# Patient Record
Sex: Male | Born: 1950 | Race: White | Hispanic: No | Marital: Single | Smoking: Never smoker
Health system: Southern US, Community
[De-identification: ages and names within clinical notes are randomized; demographics above are authoritative.]

## PROBLEM LIST (undated history)

## (undated) DIAGNOSIS — K219 Gastro-esophageal reflux disease without esophagitis: Secondary | ICD-10-CM

## (undated) DIAGNOSIS — J45909 Unspecified asthma, uncomplicated: Secondary | ICD-10-CM

## (undated) DIAGNOSIS — L720 Epidermal cyst: Secondary | ICD-10-CM

## (undated) DIAGNOSIS — I10 Essential (primary) hypertension: Secondary | ICD-10-CM

## (undated) DIAGNOSIS — M1990 Unspecified osteoarthritis, unspecified site: Secondary | ICD-10-CM

## (undated) DIAGNOSIS — G4733 Obstructive sleep apnea (adult) (pediatric): Principal | ICD-10-CM

## (undated) DIAGNOSIS — G709 Myoneural disorder, unspecified: Secondary | ICD-10-CM

## (undated) HISTORY — PX: WRIST FRACTURE SURGERY: SHX121

## (undated) HISTORY — PX: NASAL SEPTUM SURGERY: SHX37

## (undated) HISTORY — PX: TONSILLECTOMY: SUR1361

## (undated) HISTORY — DX: Obstructive sleep apnea (adult) (pediatric): G47.33

## (undated) HISTORY — PX: HERNIA REPAIR: SHX51

## (undated) HISTORY — DX: Essential (primary) hypertension: I10

## (undated) HISTORY — DX: Unspecified asthma, uncomplicated: J45.909

---

## 2010-08-09 ENCOUNTER — Emergency Department (HOSPITAL_COMMUNITY)
Admission: EM | Admit: 2010-08-09 | Discharge: 2010-08-09 | Payer: Self-pay | Source: Home / Self Care | Admitting: Emergency Medicine

## 2011-02-17 ENCOUNTER — Emergency Department (HOSPITAL_COMMUNITY)
Admission: EM | Admit: 2011-02-17 | Discharge: 2011-02-17 | Disposition: A | Discharge status: Home / Self Care | Payer: BC Managed Care – PPO | Attending: Emergency Medicine | Admitting: Emergency Medicine

## 2011-02-17 ENCOUNTER — Emergency Department (HOSPITAL_COMMUNITY): Payer: BC Managed Care – PPO

## 2011-02-17 DIAGNOSIS — R0989 Other specified symptoms and signs involving the circulatory and respiratory systems: Secondary | ICD-10-CM | POA: Insufficient documentation

## 2011-02-17 DIAGNOSIS — K219 Gastro-esophageal reflux disease without esophagitis: Secondary | ICD-10-CM | POA: Insufficient documentation

## 2011-02-17 DIAGNOSIS — I10 Essential (primary) hypertension: Secondary | ICD-10-CM | POA: Insufficient documentation

## 2011-02-17 DIAGNOSIS — R0609 Other forms of dyspnea: Secondary | ICD-10-CM | POA: Insufficient documentation

## 2011-02-17 LAB — DIFFERENTIAL
Band Neutrophils: 0 % (ref 0–10)
Basophils Absolute: 0 10*3/uL (ref 0.0–0.1)
Basophils Relative: 0 % (ref 0–1)
Blasts: 0 %
Eosinophils Absolute: 1.1 10*3/uL — ABNORMAL HIGH (ref 0.0–0.7)
Eosinophils Relative: 14 % — ABNORMAL HIGH (ref 0–5)
Lymphocytes Relative: 33 % (ref 12–46)
Lymphs Abs: 2.5 10*3/uL (ref 0.7–4.0)
Metamyelocytes Relative: 0 %
Monocytes Absolute: 0.3 10*3/uL (ref 0.1–1.0)
Monocytes Relative: 4 % (ref 3–12)
Myelocytes: 0 %
Neutro Abs: 3.8 10*3/uL (ref 1.7–7.7)
Neutrophils Relative %: 49 % (ref 43–77)
Promyelocytes Absolute: 0 %
nRBC: 0 /100 WBC

## 2011-02-17 LAB — BLOOD GAS, VENOUS
Acid-Base Excess: 2.2 mmol/L — ABNORMAL HIGH (ref 0.0–2.0)
Bicarbonate: 27 mEq/L — ABNORMAL HIGH (ref 20.0–24.0)
Drawn by: 235321
FIO2: 0.21 %
O2 Saturation: 78.6 %
Patient temperature: 98.6
TCO2: 23.2 mmol/L (ref 0–100)
pCO2, Ven: 44.2 mmHg — ABNORMAL LOW (ref 45.0–50.0)
pH, Ven: 7.402 — ABNORMAL HIGH (ref 7.250–7.300)
pO2, Ven: 44.8 mmHg (ref 30.0–45.0)

## 2011-02-17 LAB — POCT I-STAT, CHEM 8
BUN: 13 mg/dL (ref 6–23)
Calcium, Ion: 1.11 mmol/L — ABNORMAL LOW (ref 1.12–1.32)
Chloride: 103 mEq/L (ref 96–112)
Creatinine, Ser: 1 mg/dL (ref 0.50–1.35)
Glucose, Bld: 105 mg/dL — ABNORMAL HIGH (ref 70–99)
HCT: 50 % (ref 39.0–52.0)
Hemoglobin: 17 g/dL (ref 13.0–17.0)
Potassium: 3.4 mEq/L — ABNORMAL LOW (ref 3.5–5.1)
Sodium: 139 mEq/L (ref 135–145)
TCO2: 24 mmol/L (ref 0–100)

## 2011-02-17 LAB — CBC
HCT: 42.9 % (ref 39.0–52.0)
Hemoglobin: 14.9 g/dL (ref 13.0–17.0)
MCH: 30.8 pg (ref 26.0–34.0)
MCHC: 34.7 g/dL (ref 30.0–36.0)
MCV: 88.8 fL (ref 78.0–100.0)
Platelets: 208 10*3/uL (ref 150–400)
RBC: 4.83 MIL/uL (ref 4.22–5.81)
RDW: 12.6 % (ref 11.5–15.5)
WBC: 7.7 10*3/uL (ref 4.0–10.5)

## 2011-02-17 LAB — TROPONIN I: Troponin I: <0.3 ng/mL (ref <0.30)

## 2011-02-17 LAB — CK TOTAL AND CKMB (NOT AT ARMC)
CK, MB: 3.2 ng/mL (ref 0.3–4.0)
Relative Index: 2.5 (ref 0.0–2.5)
Total CK: 127 U/L (ref 7–232)

## 2011-02-17 LAB — PROTIME-INR
INR: 1.04 (ref 0.00–1.49)
Prothrombin Time: 13.8 seconds (ref 11.6–15.2)

## 2011-02-17 LAB — APTT: aPTT: 29 seconds (ref 24–37)

## 2011-02-17 LAB — D-DIMER, QUANTITATIVE: D-Dimer, Quant: 0.36 ug/mL-FEU (ref 0.00–0.48)

## 2011-11-07 ENCOUNTER — Ambulatory Visit: Payer: BC Managed Care – PPO | Admitting: Family Medicine

## 2012-07-26 ENCOUNTER — Other Ambulatory Visit: Payer: Self-pay

## 2013-01-27 ENCOUNTER — Encounter (INDEPENDENT_AMBULATORY_CARE_PROVIDER_SITE_OTHER): Payer: Self-pay | Admitting: Surgery

## 2013-01-27 ENCOUNTER — Ambulatory Visit (INDEPENDENT_AMBULATORY_CARE_PROVIDER_SITE_OTHER): Payer: BC Managed Care – PPO | Admitting: Surgery

## 2013-01-27 VITALS — BP 138/76 | HR 78 | Temp 98.0°F | Resp 20 | Ht 71.0 in | Wt 246.0 lb

## 2013-01-27 DIAGNOSIS — K420 Umbilical hernia with obstruction, without gangrene: Secondary | ICD-10-CM

## 2013-01-27 NOTE — Patient Instructions (Addendum)
[facility]  HERNIA REPAIR POST OP INSTRUCTIONS  Always review your discharge instruction sheet given to you by the facility where your surgery was performed.  1. A  prescription for pain medication may be given to you upon discharge.  Take your pain medication as prescribed.  If narcotic pain medicine is not needed, then you may take acetaminophen (Tylenol) or ibuprofen (Advil) as needed.  2. Take your usually prescribed medications unless otherwise directed.  3. If you need a refill on your pain medication, please contact your pharmacy.  They will contact our office to request authorization. Prescriptions will not be filled after 5 pm daily or on weekends.  4. You should follow a light diet the first 24 hours after arrival home, such as soup and crackers or toast.  Be sure to include plenty of fluids daily.  Resume your normal diet the day after surgery.  5. Most patients will experience some swelling and bruising around the surgical site.  Ice packs and reclining will help.  Swelling and bruising can take several days to resolve.   6. It is common to experience some constipation if taking pain medication after surgery.  Increasing fluid intake and taking a stool softener (such as Colace) will usually help or prevent this problem from occurring.  A mild laxative (Milk of Magnesia or Miralax) should be taken according to package directions if there are no bowel movements after 48 hours.  7. Unless discharge instructions indicate otherwise, you may remove your bandages 24-48 hours after surgery, and you may shower at that time.  You may have steri-strips (small skin tapes) in place directly over the incision.  These strips should be left on the skin for 7-10 days.  If your surgeon used skin glue on the incision, you may shower in 24 hours.  The glue will flake off over the next 2-3 weeks.  Any sutures or staples will be removed at the office during your follow-up  visit.  8. ACTIVITIES:  You may resume regular (light) daily activities beginning the next day-such as daily self-care, walking, climbing stairs-gradually increasing activities as tolerated.  You may have sexual intercourse when it is comfortable.  Refrain from any heavy lifting or straining until approved by your doctor.  You may drive when you are no longer taking prescription pain medication, you can comfortably wear a seatbelt, and you can safely maneuver your car and apply brakes.  9. You should see your doctor in the office for a follow-up appointment approximately 2-3 weeks after your surgery.  Make sure that you call for this appointment within a day or two after you arrive home to insure a convenient appointment time. 10.   WHEN TO CALL YOUR DOCTOR: 1. Fever greater than 101.0 2. Inability to urinate 3. Persistent nausea and/or vomiting 4. Extreme swelling or bruising 5. Continued bleeding from incision 6. Increased pain, redness, or drainage from the incision  The clinic staff is available to answer your questions during regular business hours.  Please don't hesitate to call and ask to speak to one of the nurses for clinical concerns.  If you have a medical emergency, go to the nearest emergency room or call 911.  A surgeon from [facility] is always on call for the hospital.   [facility] [address], [address], [place], [place]  [address]  [phone] ? [phone] ? FAX [phone]  [email or web address]   

## 2013-01-27 NOTE — Progress Notes (Signed)
General Surgery [facility]  Chief Complaint  Patient presents with  . Umbilical Hernia    eval umb hernia    HISTORY: Patient is a 62 year old male who is self-referred for umbilical hernia repair. Patient was noted to have an umbilical hernia approximately 6-7 years ago by his chiropractor. It has slowly enlarged. It causes him intermittent discomfort. He has had no signs or symptoms of obstruction. He has had no prior abdominal surgery. He presents today to consider repair.  Past Medical History  Diagnosis Date  . Hypertension   . Asthma      Current Outpatient Prescriptions  Medication Sig Dispense Refill  . lisinopril (PRINIVIL,ZESTRIL) 20 MG tablet Take 20 mg by mouth daily.      . montelukast (SINGULAIR) 10 MG tablet Take 10 mg by mouth at bedtime.       No current facility-administered medications for this visit.     Allergies  Allergen Reactions  . Vicodin (Hydrocodone-Acetaminophen) Palpitations     History reviewed. No pertinent family history.   History   Social History  . Marital Status: Single    Spouse Name: N/A    Number of Children: N/A  . Years of Education: N/A   Social History Main Topics  . Smoking status: Never Smoker   . Smokeless tobacco: None  . Alcohol Use: Yes  . Drug Use: No  . Sexually Active: None   Other Topics Concern  . None   Social History Narrative  . None     REVIEW OF SYSTEMS - PERTINENT POSITIVES ONLY: Rare discomfort. No obstructive symptoms.  EXAM: There were no vitals filed for this visit.  HEENT: normocephalic; pupils equal and reactive; sclerae clear; dentition good; mucous membranes moist NECK:  symmetric on extension; no palpable anterior or posterior cervical lymphadenopathy; no supraclavicular masses; no tenderness CHEST: clear to auscultation bilaterally with scattered rhonchi and wheezes CARDIAC: regular rate and rhythm without significant murmur; peripheral pulses are  full ABDOMEN: soft without distension; bowel sounds present; no mass; no hepatosplenomegaly; Moderate rectus diastases; obvious umbilical hernia with incarcerated omentum, fascial defect probably 2-3 cm in diameter EXT:  non-tender without edema; no deformity NEURO: no gross focal deficits; no sign of tremor   LABORATORY RESULTS: See [facility] (CHL-Epic) for most recent results   RADIOLOGY RESULTS: See [facility] (CHL-Epic) for most recent results   IMPRESSION: Incarcerated umbilical hernia, minimally symptomatic  PLAN: The patient and I discussed surgical repair. We discussed the use of synthetic mesh. We discussed the outpatient procedure, the restrictions on his activities following the procedure, and the time frame of his return to normal activity. He understands and wishes to proceed. I provided him with written literature to review regarding the surgery. We will make arrangements for outpatient surgery in the near future.  The risks and benefits of the procedure have been discussed at length with the patient.  The patient understands the proposed procedure, potential alternative treatments, and the course of recovery to be expected.  All of the patient's questions have been answered at this time.  The patient wishes to proceed with surgery.  Velora Heckler, MD, FACS General & Endocrine Surgery [facility]   Visit Diagnoses: 1. Umbilical hernia, incarcerated     Primary Care Physician: Provider Not In System

## 2013-02-17 ENCOUNTER — Telehealth (INDEPENDENT_AMBULATORY_CARE_PROVIDER_SITE_OTHER): Payer: Self-pay | Admitting: General Surgery

## 2013-02-17 NOTE — Telephone Encounter (Signed)
This patient was a face to face walk in on 02/17/13 he has questions about the following  . He was in a [place] an the friends ask why he was not on a machine?  he thought he might have COPD. He thought his blood pressure might be up and was a risk for surgery, he says it was 140/72. I advised him to contact his PCP and to go by and have it checked  if he felt it was high . He ask if his insurance was going to cover the procedure ? I ask him if he had spoke to the  Surgery scheduler here at [facility] ? He said yes they had talk to him about everything .In general I feel he is having a bit of anxiety over his up coming surgery with Dr Gerrit Friends on 03/01/13. After a long conversation he said he was feeling much calmer.

## 2013-02-25 ENCOUNTER — Inpatient Hospital Stay (HOSPITAL_COMMUNITY): Admission: RE | Admit: 2013-02-25 | Payer: BC Managed Care – PPO | Source: Ambulatory Visit

## 2013-03-15 ENCOUNTER — Encounter (INDEPENDENT_AMBULATORY_CARE_PROVIDER_SITE_OTHER): Payer: BC Managed Care – PPO | Admitting: Surgery

## 2013-04-01 ENCOUNTER — Encounter (HOSPITAL_COMMUNITY): Payer: Self-pay | Admitting: Pharmacy Technician

## 2013-04-05 ENCOUNTER — Other Ambulatory Visit (HOSPITAL_COMMUNITY): Payer: Self-pay | Admitting: Surgery

## 2013-04-05 NOTE — Patient Instructions (Addendum)
20 Jamie Schwartz  04/05/2013   Your procedure is scheduled on:04/14/13  THURSDAY    Report to [facility] at   0530    AM.  Call this number if you have problems the morning of surgery: [phone]       Remember: [name] WITH YOU TO HOSPITAL  Do not eat food  Or drink :After Midnight. Wednesday NIGHT   Take these medicines the morning of surgery with A SIP OF WATER:Singulair        May use albuterol if needed   .  Contacts, dentures or partial plates can not be worn to surgery  Leave suitcase in the car. After surgery it may be brought to your room.  For patients admitted to the hospital, checkout time is 11:00 AM day of  discharge.             SPECIAL INSTRUCTIONS- SEE [place] PREPARING FOR SURGERY INSTRUCTION SHEET-     DO NOT WEAR JEWELRY, LOTIONS, POWDERS, OR PERFUMES.  WOMEN-- DO NOT SHAVE LEGS OR UNDERARMS FOR 12 HOURS BEFORE SHOWERS. MEN MAY SHAVE FACE.  Patients discharged the day of surgery will not be allowed to drive home. IF going home the day of surgery, you must have a driver and someone to stay with you for the first 24 hours  Name and phone number of your driver:  States will arrange before surgery                                                                                  [name]  PST [phone]  [phone]                 FAILURE TO FOLLOW THESE INSTRUCTIONS MAY RESULT IN  CANCELLATION   OF YOUR SURGERY                                                  Patient Signature _____________________________

## 2013-04-06 ENCOUNTER — Ambulatory Visit (HOSPITAL_COMMUNITY)
Admission: RE | Admit: 2013-04-06 | Discharge: 2013-04-06 | Disposition: A | Discharge status: Home / Self Care | Payer: BC Managed Care – PPO | Source: Ambulatory Visit | Attending: Surgery | Admitting: Surgery

## 2013-04-06 ENCOUNTER — Encounter (HOSPITAL_COMMUNITY): Payer: Self-pay

## 2013-04-06 ENCOUNTER — Encounter (HOSPITAL_COMMUNITY)
Admission: RE | Admit: 2013-04-06 | Discharge: 2013-04-06 | Disposition: A | Discharge status: Home / Self Care | Payer: BC Managed Care – PPO | Source: Ambulatory Visit | Attending: Surgery | Admitting: Surgery

## 2013-04-06 DIAGNOSIS — Z01810 Encounter for preprocedural cardiovascular examination: Secondary | ICD-10-CM | POA: Insufficient documentation

## 2013-04-06 DIAGNOSIS — Z01812 Encounter for preprocedural laboratory examination: Secondary | ICD-10-CM | POA: Insufficient documentation

## 2013-04-06 DIAGNOSIS — K429 Umbilical hernia without obstruction or gangrene: Secondary | ICD-10-CM | POA: Insufficient documentation

## 2013-04-06 DIAGNOSIS — Z01818 Encounter for other preprocedural examination: Secondary | ICD-10-CM | POA: Insufficient documentation

## 2013-04-06 HISTORY — DX: Gastro-esophageal reflux disease without esophagitis: K21.9

## 2013-04-06 HISTORY — DX: Unspecified osteoarthritis, unspecified site: M19.90

## 2013-04-06 LAB — BASIC METABOLIC PANEL
BUN: 14 mg/dL (ref 6–23)
CO2: 30 mEq/L (ref 19–32)
Calcium: 9.7 mg/dL (ref 8.4–10.5)
Chloride: 102 mEq/L (ref 96–112)
Creatinine, Ser: 0.91 mg/dL (ref 0.50–1.35)
GFR calc Af Amer: >90 mL/min (ref >90)
GFR calc non Af Amer: >90 mL/min (ref >90)
Glucose, Bld: 105 mg/dL — ABNORMAL HIGH (ref 70–99)
Potassium: 4.1 mEq/L (ref 3.5–5.1)
Sodium: 138 mEq/L (ref 135–145)

## 2013-04-06 LAB — CBC
HCT: 44.6 % (ref 39.0–52.0)
Hemoglobin: 15.4 g/dL (ref 13.0–17.0)
MCH: 31.4 pg (ref 26.0–34.0)
MCHC: 34.5 g/dL (ref 30.0–36.0)
MCV: 90.8 fL (ref 78.0–100.0)
Platelets: 242 10*3/uL (ref 150–400)
RBC: 4.91 MIL/uL (ref 4.22–5.81)
RDW: 13.1 % (ref 11.5–15.5)
WBC: 6.1 10*3/uL (ref 4.0–10.5)

## 2013-04-06 NOTE — Progress Notes (Signed)
04/06/13 1021  OBSTRUCTIVE SLEEP APNEA  Have you ever been diagnosed with sleep apnea through a sleep study? No  Do you snore loudly (loud enough to be heard through closed doors)?  1  Do you often feel tired, fatigued, or sleepy during the daytime? 0  Has anyone observed you stop breathing during your sleep? 0  Do you have, or are you being treated for high blood pressure? 1  BMI more than 35 kg/m2? 0  Age over 60 years old? 1  Neck circumference greater than 40 cm/18 inches? 0  Gender: 1  Obstructive Sleep Apnea Score 4  Score 4 or greater  Results sent to PCP

## 2013-04-06 NOTE — Progress Notes (Signed)
Faxed STOP BAND SCREEN to [facility]-  Ginger Carne at [phone] with confirmation

## 2013-04-07 ENCOUNTER — Telehealth (INDEPENDENT_AMBULATORY_CARE_PROVIDER_SITE_OTHER): Payer: Self-pay | Admitting: General Surgery

## 2013-04-07 NOTE — Telephone Encounter (Signed)
Patient called in explaining that he is worried for after surgery because he has no one that can help him.  He explained that he has no relatives and no friends that can help him.  He wanted to know if there is a facility that he could go to or if he could stay in the hospital for more than an outpatient procedure.  I explained that this would be orders that Dr. Gerrit Friends would have to put in if he wanted to extended his stay at the hospital.  Explained that I would forward this message to him and his nurse and have them call him back.

## 2013-04-07 NOTE — Telephone Encounter (Signed)
Patient is scheduled for umbilical hernia repair.  Will likely not require any post op care that he cannot provide for himself.  This is a semi-elective procedure, and if he thinks he will require help, he can postpone until such time as he has help at home.  There is no "facility" to which he can go after his procedure for care.  Velora Heckler, MD, [facility] Office: [phone]

## 2013-04-08 NOTE — Telephone Encounter (Signed)
Pt given below note verbally and states he understands.

## 2013-04-13 ENCOUNTER — Telehealth (INDEPENDENT_AMBULATORY_CARE_PROVIDER_SITE_OTHER): Payer: Self-pay | Admitting: *Deleted

## 2013-04-13 NOTE — Telephone Encounter (Signed)
Kim NP called to let us know that patient's surgery for tomorrow morning would need to be cancelled due to patient is in her office with an asthma exacerbation; sating 92% and difficulty breathing.  Spoke to Parrott LPN who sent Dr. Gerrit Friends a text trying to update him, told Debbie E with surgery schedulers.

## 2013-04-14 ENCOUNTER — Encounter (HOSPITAL_COMMUNITY): Admission: RE | Payer: Self-pay | Source: Ambulatory Visit

## 2013-04-14 ENCOUNTER — Ambulatory Visit (HOSPITAL_COMMUNITY): Admission: RE | Admit: 2013-04-14 | Payer: BC Managed Care – PPO | Source: Ambulatory Visit | Admitting: Surgery

## 2013-04-14 SURGERY — REPAIR, HERNIA, UMBILICAL, ADULT
Anesthesia: General

## 2013-05-10 ENCOUNTER — Encounter (INDEPENDENT_AMBULATORY_CARE_PROVIDER_SITE_OTHER): Payer: BC Managed Care – PPO | Admitting: Surgery

## 2013-07-19 NOTE — Progress Notes (Signed)
Need orders in EPIC.  Surgery scheduled for 08/05/13.  Preop on 08/02/13 at 100pm

## 2013-07-21 ENCOUNTER — Other Ambulatory Visit (INDEPENDENT_AMBULATORY_CARE_PROVIDER_SITE_OTHER): Payer: Self-pay | Admitting: Surgery

## 2013-07-29 ENCOUNTER — Encounter (HOSPITAL_COMMUNITY): Payer: Self-pay | Admitting: Pharmacy Technician

## 2013-08-02 ENCOUNTER — Encounter (HOSPITAL_COMMUNITY): Payer: Self-pay

## 2013-08-02 ENCOUNTER — Encounter (INDEPENDENT_AMBULATORY_CARE_PROVIDER_SITE_OTHER): Payer: Self-pay

## 2013-08-02 ENCOUNTER — Encounter (HOSPITAL_COMMUNITY)
Admission: RE | Admit: 2013-08-02 | Discharge: 2013-08-02 | Disposition: A | Discharge status: Home / Self Care | Payer: BC Managed Care – PPO | Source: Ambulatory Visit | Attending: Surgery | Admitting: Surgery

## 2013-08-02 DIAGNOSIS — L720 Epidermal cyst: Secondary | ICD-10-CM

## 2013-08-02 HISTORY — DX: Epidermal cyst: L72.0

## 2013-08-02 HISTORY — DX: Myoneural disorder, unspecified: G70.9

## 2013-08-02 LAB — BASIC METABOLIC PANEL
BUN: 12 mg/dL (ref 6–23)
CO2: 27 mEq/L (ref 19–32)
Calcium: 9.5 mg/dL (ref 8.4–10.5)
Chloride: 102 mEq/L (ref 96–112)
Creatinine, Ser: 0.8 mg/dL (ref 0.50–1.35)
GFR calc Af Amer: >90 mL/min (ref >90)
GFR calc non Af Amer: >90 mL/min (ref >90)
Glucose, Bld: 95 mg/dL (ref 70–99)
Potassium: 4 mEq/L (ref 3.5–5.1)
Sodium: 139 mEq/L (ref 135–145)

## 2013-08-02 LAB — CBC
HCT: 43.3 % (ref 39.0–52.0)
Hemoglobin: 14.6 g/dL (ref 13.0–17.0)
MCH: 31.2 pg (ref 26.0–34.0)
MCHC: 33.7 g/dL (ref 30.0–36.0)
MCV: 92.5 fL (ref 78.0–100.0)
Platelets: 254 10*3/uL (ref 150–400)
RBC: 4.68 MIL/uL (ref 4.22–5.81)
RDW: 13 % (ref 11.5–15.5)
WBC: 6.9 10*3/uL (ref 4.0–10.5)

## 2013-08-02 NOTE — Progress Notes (Signed)
Your Pt has screened with an elevated risk for obstructive sleep apnea using the Stop-Bang tool during a presurgical  Visit. A score of four or greater is an elevated risk. 

## 2013-08-02 NOTE — Pre-Procedure Instructions (Signed)
08-02-13 EKG/ CXR done today.

## 2013-08-02 NOTE — Patient Instructions (Addendum)
20 [name]  08/02/2013   Your procedure is scheduled on:   08-05-2013  Report to [facility] at     0700   AM .  Call this number if you have problems the morning of surgery: [phone]  Or Presurgical Testing [phone](Wilhemina)  For Living Will and/or Health Care Power Attorney Forms: please provide copy for your medical record,may bring AM of surgery(Forms should be already notarized -we do not provide this service).   Remember: Follow any bowel prep instructions per MD office. For Cpap use: Bring mask and tubing only.   Do not eat food:After Midnight.      Take these medicines the morning of surgery with A SIP OF WATER: Singulair. Use/bring Albuterol inhaler.   Do not wear jewelry, make-up or nail polish.  Do not wear lotions, powders, or perfumes. You may wear deodorant.  Do not shave 12 hours prior to first CHG shower(legs and under arms).(face and neck okay.)  Do not bring valuables to the hospital.(Hospital is not responsible for lost valuables).  Contacts, dentures or removable bridgework, body piercing, hair pins may not be worn into surgery.  Leave suitcase in the car. After surgery it may be brought to your room.  For patients admitted to the hospital, checkout time is 11:00 AM the day of discharge.   Patients discharged the day of surgery will not be allowed to drive home. Must have responsible person with you x 24 hours once discharged.  Name and phone number of your driver: Milagros Evener  Special Instructions: CHG(Chlorhedine 4%-"Hibiclens","Betasept","Aplicare") Shower Use Special Wash: see special instructions.(avoid face and genitals)     Failure to follow these instructions may result in Cancellation of your surgery.   Patient signature_______________________________________________________      PT NOTIFIED HIS SURGERY TIME WAS CHANGED TO 7:30 AM ON Friday 08/05/13 - AND HE SHOULD ARRIVE TO SHORT STAY CENTER BY 5:30 AM - ALL OTHER  INSTRUCTIONS THE SAME.

## 2013-08-03 NOTE — Progress Notes (Signed)
Quick Note:  These results are acceptable for scheduled surgery.  Shakthi Scipio M. Yovanna Cogan, MD, FACS [facility] Office: [phone]   ______ 

## 2013-08-05 ENCOUNTER — Telehealth (INDEPENDENT_AMBULATORY_CARE_PROVIDER_SITE_OTHER): Payer: Self-pay | Admitting: *Deleted

## 2013-08-05 ENCOUNTER — Ambulatory Visit (HOSPITAL_COMMUNITY): Payer: BC Managed Care – PPO | Admitting: Anesthesiology

## 2013-08-05 ENCOUNTER — Encounter (HOSPITAL_COMMUNITY): Payer: BC Managed Care – PPO | Admitting: Anesthesiology

## 2013-08-05 ENCOUNTER — Encounter (HOSPITAL_COMMUNITY): Payer: Self-pay | Admitting: *Deleted

## 2013-08-05 ENCOUNTER — Ambulatory Visit (HOSPITAL_COMMUNITY)
Admission: RE | Admit: 2013-08-05 | Discharge: 2013-08-05 | Disposition: A | Discharge status: Home / Self Care | Payer: BC Managed Care – PPO | Source: Ambulatory Visit | Attending: Surgery | Admitting: Surgery

## 2013-08-05 ENCOUNTER — Encounter (HOSPITAL_COMMUNITY)
Admission: RE | Disposition: A | Discharge status: Home / Self Care | Payer: Self-pay | Source: Ambulatory Visit | Attending: Surgery

## 2013-08-05 DIAGNOSIS — K420 Umbilical hernia with obstruction, without gangrene: Secondary | ICD-10-CM | POA: Diagnosis present

## 2013-08-05 DIAGNOSIS — Z79899 Other long term (current) drug therapy: Secondary | ICD-10-CM | POA: Insufficient documentation

## 2013-08-05 DIAGNOSIS — R0602 Shortness of breath: Secondary | ICD-10-CM | POA: Insufficient documentation

## 2013-08-05 DIAGNOSIS — K219 Gastro-esophageal reflux disease without esophagitis: Secondary | ICD-10-CM | POA: Insufficient documentation

## 2013-08-05 DIAGNOSIS — J45909 Unspecified asthma, uncomplicated: Secondary | ICD-10-CM | POA: Insufficient documentation

## 2013-08-05 DIAGNOSIS — I10 Essential (primary) hypertension: Secondary | ICD-10-CM | POA: Insufficient documentation

## 2013-08-05 HISTORY — PX: UMBILICAL HERNIA REPAIR: SHX196

## 2013-08-05 HISTORY — PX: INSERTION OF MESH: SHX5868

## 2013-08-05 SURGERY — REPAIR, HERNIA, UMBILICAL, ADULT
Anesthesia: General | Site: Abdomen

## 2013-08-05 MED ORDER — HYDROMORPHONE HCL PF 1 MG/ML IJ SOLN
INTRAMUSCULAR | Status: AC
  Filled 2013-08-05: qty 1

## 2013-08-05 MED ORDER — FENTANYL CITRATE 0.05 MG/ML IJ SOLN
INTRAMUSCULAR | Status: DC | PRN
  Administered 2013-08-05 (×5): 50 ug via INTRAVENOUS

## 2013-08-05 MED ORDER — BUPIVACAINE HCL 0.25 % IJ SOLN
INTRAMUSCULAR | Status: DC | PRN
  Administered 2013-08-05: 20 mL

## 2013-08-05 MED ORDER — 0.9 % SODIUM CHLORIDE (POUR BTL) OPTIME
TOPICAL | Status: DC | PRN
  Administered 2013-08-05: 1000 mL

## 2013-08-05 MED ORDER — CEFAZOLIN SODIUM-DEXTROSE 2-3 GM-% IV SOLR
INTRAVENOUS | Status: AC
  Filled 2013-08-05: qty 50

## 2013-08-05 MED ORDER — LIDOCAINE HCL (CARDIAC) 20 MG/ML IV SOLN
INTRAVENOUS | Status: AC
  Filled 2013-08-05: qty 5

## 2013-08-05 MED ORDER — BUPIVACAINE HCL (PF) 0.25 % IJ SOLN
INTRAMUSCULAR | Status: AC
  Filled 2013-08-05: qty 30

## 2013-08-05 MED ORDER — HYDROMORPHONE HCL PF 1 MG/ML IJ SOLN
0.2500 mg | INTRAMUSCULAR | Status: DC | PRN
  Administered 2013-08-05 (×2): 0.5 mg via INTRAVENOUS

## 2013-08-05 MED ORDER — MIDAZOLAM HCL 2 MG/2ML IJ SOLN
INTRAMUSCULAR | Status: AC
  Filled 2013-08-05: qty 2

## 2013-08-05 MED ORDER — OXYCODONE-ACETAMINOPHEN 5-325 MG PO TABS: 1.0000 | ORAL_TABLET | ORAL | Status: DC | PRN

## 2013-08-05 MED ORDER — PROPOFOL 10 MG/ML IV BOLUS
INTRAVENOUS | Status: DC | PRN
  Administered 2013-08-05: 200 mg via INTRAVENOUS

## 2013-08-05 MED ORDER — ONDANSETRON HCL 4 MG/2ML IJ SOLN
INTRAMUSCULAR | Status: DC | PRN
  Administered 2013-08-05: 4 mg via INTRAVENOUS

## 2013-08-05 MED ORDER — EPHEDRINE SULFATE 50 MG/ML IJ SOLN
INTRAMUSCULAR | Status: AC
  Filled 2013-08-05: qty 1

## 2013-08-05 MED ORDER — METOCLOPRAMIDE HCL 5 MG/ML IJ SOLN
INTRAMUSCULAR | Status: DC | PRN
  Administered 2013-08-05: 10 mg via INTRAVENOUS

## 2013-08-05 MED ORDER — LACTATED RINGERS IV SOLN
INTRAVENOUS | Status: DC | PRN
  Administered 2013-08-05: 07:00:00 via INTRAVENOUS

## 2013-08-05 MED ORDER — MIDAZOLAM HCL 5 MG/5ML IJ SOLN
INTRAMUSCULAR | Status: DC | PRN
  Administered 2013-08-05: 2 mg via INTRAVENOUS

## 2013-08-05 MED ORDER — METOCLOPRAMIDE HCL 5 MG/ML IJ SOLN
INTRAMUSCULAR | Status: AC
  Filled 2013-08-05: qty 2

## 2013-08-05 MED ORDER — CEFAZOLIN SODIUM-DEXTROSE 2-3 GM-% IV SOLR
2.0000 g | INTRAVENOUS | Status: AC
  Administered 2013-08-05: 2 g via INTRAVENOUS

## 2013-08-05 MED ORDER — ONDANSETRON HCL 4 MG/2ML IJ SOLN
INTRAMUSCULAR | Status: AC
  Filled 2013-08-05: qty 2

## 2013-08-05 MED ORDER — FENTANYL CITRATE 0.05 MG/ML IJ SOLN
INTRAMUSCULAR | Status: AC
  Filled 2013-08-05: qty 5

## 2013-08-05 MED ORDER — LIDOCAINE HCL 1 % IJ SOLN
INTRAMUSCULAR | Status: DC | PRN
  Administered 2013-08-05: 100 mg via INTRADERMAL

## 2013-08-05 MED ORDER — LACTATED RINGERS IV SOLN: INTRAVENOUS | Status: DC

## 2013-08-05 MED ORDER — PROPOFOL 10 MG/ML IV BOLUS
INTRAVENOUS | Status: AC
  Filled 2013-08-05: qty 20

## 2013-08-05 SURGICAL SUPPLY — 31 items
BENZOIN TINCTURE PRP APPL 2/3 (GAUZE/BANDAGES/DRESSINGS) ×3 IMPLANT
BLADE HEX COATED 2.75 (ELECTRODE) ×3 IMPLANT
BLADE SURG SZ10 CARB STEEL (BLADE) ×6 IMPLANT
CHLORAPREP W/TINT 26ML (MISCELLANEOUS) ×3 IMPLANT
CLEANER TIP ELECTROSURG 2X2 (MISCELLANEOUS) ×3 IMPLANT
DECANTER SPIKE VIAL GLASS SM (MISCELLANEOUS) IMPLANT
DRAPE LAPAROTOMY T 102X78X121 (DRAPES) ×3 IMPLANT
ELECT REM PT RETURN 9FT ADLT (ELECTROSURGICAL) ×3
ELECTRODE REM PT RTRN 9FT ADLT (ELECTROSURGICAL) ×2 IMPLANT
GLOVE BIOGEL PI IND STRL 7.0 (GLOVE) ×2 IMPLANT
GLOVE BIOGEL PI INDICATOR 7.0 (GLOVE) ×1
GLOVE SURG ORTHO 8.0 STRL STRW (GLOVE) ×3 IMPLANT
GOWN PREVENTION PLUS LG XLONG (DISPOSABLE) ×3 IMPLANT
GOWN STRL REIN XL XLG (GOWN DISPOSABLE) ×6 IMPLANT
KIT BASIN OR (CUSTOM PROCEDURE TRAY) ×3 IMPLANT
MARKER SKIN DUAL TIP RULER LAB (MISCELLANEOUS) ×3 IMPLANT
MESH VENTRALEX ST 2.5 CRC MED (Mesh General) ×3 IMPLANT
NEEDLE HYPO 25X1 1.5 SAFETY (NEEDLE) ×3 IMPLANT
NS IRRIG 1000ML POUR BTL (IV SOLUTION) ×3 IMPLANT
PACK BASIC VI WITH GOWN DISP (CUSTOM PROCEDURE TRAY) ×3 IMPLANT
PENCIL BUTTON HOLSTER BLD 10FT (ELECTRODE) ×3 IMPLANT
SPONGE GAUZE 4X4 12PLY (GAUZE/BANDAGES/DRESSINGS) ×3 IMPLANT
SPONGE LAP 4X18 X RAY DECT (DISPOSABLE) IMPLANT
STRIP CLOSURE SKIN 1/2X4 (GAUZE/BANDAGES/DRESSINGS) ×3 IMPLANT
SUT ETHIBOND NAB CT1 #1 30IN (SUTURE) ×3 IMPLANT
SUT MNCRL AB 4-0 PS2 18 (SUTURE) ×3 IMPLANT
SUT VIC AB 3-0 SH 18 (SUTURE) ×3 IMPLANT
SYR CONTROL 10ML LL (SYRINGE) ×3 IMPLANT
TAPE CLOTH SURG 4X10 WHT LF (GAUZE/BANDAGES/DRESSINGS) ×3 IMPLANT
TOWEL OR 17X26 10 PK STRL BLUE (TOWEL DISPOSABLE) ×3 IMPLANT
WATER STERILE IRR 1500ML POUR (IV SOLUTION) IMPLANT

## 2013-08-05 NOTE — Transfer of Care (Signed)
Immediate Anesthesia Transfer of Care Note  Patient: Jamie Schwartz  Procedure(s) Performed: Procedure(s): HERNIA REPAIR UMBILICAL ADULT (N/A) INSERTION OF MESH (N/A)  Patient Location: PACU  Anesthesia Type:General  Level of Consciousness: awake, oriented and patient cooperative  Airway & Oxygen Therapy: Patient Spontanous Breathing and Patient connected to face mask oxygen  Post-op Assessment: Report given to PACU RN, Post -op Vital signs reviewed and stable and Patient moving all extremities  Post vital signs: Reviewed and stable  Complications: No apparent anesthesia complications

## 2013-08-05 NOTE — Brief Op Note (Signed)
08/05/2013  8:37 AM  PATIENT:  Jamie Schwartz  62 y.o. male  PRE-OPERATIVE DIAGNOSIS:  INCARCERATED UMBILICAL HERNIA   POST-OPERATIVE DIAGNOSIS:  INCARCERATED UMBILICAL HERNIA   PROCEDURE:  Procedure(s): HERNIA REPAIR UMBILICAL ADULT (N/A) INSERTION OF MESH (N/A)  SURGEON:  Surgeon(s) and Role:    * Velora Heckler, MD - Primary  ANESTHESIA:   general  EBL:     BLOOD ADMINISTERED:none  DRAINS: none   LOCAL MEDICATIONS USED:  MARCAINE     SPECIMEN:  No Specimen  DISPOSITION OF SPECIMEN:  N/A  COUNTS:  YES  TOURNIQUET:  * No tourniquets in log *  DICTATION: .Other Dictation: Dictation Number [id number]  PLAN OF CARE: Discharge to home after PACU  PATIENT DISPOSITION:  PACU - hemodynamically stable.   Delay start of Pharmacological VTE agent (>24hrs) due to surgical blood loss or risk of bleeding: yes  Velora Heckler, MD, [facility] Office: [phone]

## 2013-08-05 NOTE — H&P (Signed)
Jamie Schwartz is an 62 y.o. male.    General Surgery [facility]  Chief Complaint: incarcerated umbilical hernia  HPI: Patient is a 62 year old male who is self-referred for umbilical hernia repair. Patient was noted to have an umbilical hernia approximately 6-7 years ago by his chiropractor. It has slowly enlarged. It causes him intermittent discomfort. He has had no signs or symptoms of obstruction. He has had no prior abdominal surgery. He presents today for repair.  Past Medical History  Diagnosis Date  . Hypertension   . GERD (gastroesophageal reflux disease)   . Arthritis   . Asthma     nebulizer tx used as needed. cortisone inj. for recent respiratory issues.  . Neuromuscular disorder     sciatic nerve pain in butttock-streoid inj. releived- 2 weeks ago  . Cyst of skin and subcutaneous tissue 08-02-13    posterior neck area    Past Surgical History  Procedure Laterality Date  . Tonsillectomy    . Nasal septum surgery    . Wrist fracture surgery      History reviewed. No pertinent family history. Social History:  reports that he has never smoked. He has quit using smokeless tobacco. He reports that he drinks alcohol. He reports that he does not use illicit drugs.  Allergies:  Allergies  Allergen Reactions  . Vicodin [Hydrocodone-Acetaminophen] Palpitations    SOB    Medications Prior to Admission  Medication Sig Dispense Refill  . albuterol (PROVENTIL HFA;VENTOLIN HFA) 108 (90 BASE) MCG/ACT inhaler Inhale 2 puffs into the lungs every 6 (six) hours as needed for wheezing.      Marland Kitchen lisinopril (PRINIVIL,ZESTRIL) 20 MG tablet Take 20 mg by mouth every morning.       . montelukast (SINGULAIR) 10 MG tablet Take 10 mg by mouth daily with breakfast.       . sodium chloride (OCEAN) 0.65 % nasal spray Place 1 spray into the nose daily as needed for congestion.      . Calcium-Magnesium-Vitamin D (CALCIUM MAGNESIUM PO) Take 1 capsule by mouth daily.      .  cholecalciferol (VITAMIN D) 1000 UNITS tablet Take 1,000 Units by mouth daily.      . folic acid (FOLVITE) 400 MCG tablet Take 400 mcg by mouth daily.      Marland Kitchen GARLIC PO Take 1 tablet by mouth daily.      . Melatonin 3 MG TABS Take 6-9 mg by mouth at bedtime.      . niacin 250 MG tablet Take 250 mg by mouth at bedtime.      . Red Yeast Rice Extract (RED YEAST RICE PO) Take 2 tablets by mouth at bedtime.      . selenium 50 MCG TABS tablet Take 50 mcg by mouth daily.      . vitamin C (ASCORBIC ACID) 500 MG tablet Take 500 mg by mouth daily.      . vitamin E (VITAMIN E) 1000 UNIT capsule Take 1,000 Units by mouth daily.        No results found for this or any previous visit (from the past 48 hour(s)). No results found.  Review of Systems  Constitutional: Negative.   HENT: Negative.   Eyes: Negative.   Respiratory: Positive for shortness of breath.   Cardiovascular: Negative.   Gastrointestinal: Negative.   Genitourinary: Negative.   Musculoskeletal: Negative.   Skin: Negative.   Neurological: Negative.   Endo/Heme/Allergies: Negative.   Psychiatric/Behavioral: Negative.  Blood pressure 161/82, pulse 71, temperature 97.3 F (36.3 C), temperature source Oral, resp. rate 18, SpO2 100.00%. Physical Exam  Constitutional: He is oriented to person, place, and time. He appears well-developed and well-nourished. No distress.  HENT:  Head: Normocephalic and atraumatic.  Right Ear: External ear normal.  Left Ear: External ear normal.  Eyes: Conjunctivae are normal. Pupils are equal, round, and reactive to light. No scleral icterus.  Neck: Normal range of motion. Neck supple. No thyromegaly present.  Cardiovascular: Normal rate, regular rhythm and normal heart sounds.   No murmur heard. Respiratory: Effort normal and breath sounds normal. He has no wheezes.  GI: Soft. Bowel sounds are normal. He exhibits no distension and no mass. There is no tenderness. There is no rebound and no  guarding.  Incarcerated umbilical hernia  Musculoskeletal: Normal range of motion. He exhibits no edema.  Lymphadenopathy:    He has no cervical adenopathy.  Neurological: He is alert and oriented to person, place, and time.  Skin: Skin is warm and dry.  Psychiatric: He has a normal mood and affect. His behavior is normal.     Assessment/Plan Incarcerated umbilical hernia  Plan repair with mesh  The risks and benefits of the procedure have been discussed at length with the patient.  The patient understands the proposed procedure, potential alternative treatments, and the course of recovery to be expected.  All of the patient's questions have been answered at this time.  The patient wishes to proceed with surgery.  Velora Heckler, MD, [facility] Office: [phone]    Jamie Schwartz M 08/05/2013, 7:13 AM

## 2013-08-05 NOTE — Anesthesia Preprocedure Evaluation (Addendum)
Anesthesia Evaluation  Patient identified by MRN, date of birth, ID band Patient awake    Reviewed: Allergy & Precautions, H&P , NPO status , Patient's Chart, lab work & pertinent test results  Airway Mallampati: II TM Distance: >3 FB Neck ROM: full    Dental no notable dental hx. (+) Teeth Intact and Dental Advisory Given   Pulmonary neg pulmonary ROS, asthma ,  May have sleep apnea. Mild asthma breath sounds clear to auscultation  Pulmonary exam normal       Cardiovascular Exercise Tolerance: Good hypertension, Pt. on medications negative cardio ROS  Rhythm:regular Rate:Normal     Neuro/Psych negative neurological ROS  negative psych ROS   GI/Hepatic negative GI ROS, Neg liver ROS, GERD-  Medicated and Controlled,  Endo/Other  negative endocrine ROS  Renal/GU negative Renal ROS  negative genitourinary   Musculoskeletal   Abdominal   Peds  Hematology negative hematology ROS (+)   Anesthesia Other Findings   Reproductive/Obstetrics negative OB ROS                         Anesthesia Physical Anesthesia Plan  ASA: II  Anesthesia Plan: General   Post-op Pain Management:    Induction: Intravenous  Airway Management Planned: LMA  Additional Equipment:   Intra-op Plan:   Post-operative Plan:   Informed Consent: I have reviewed the patients History and Physical, chart, labs and discussed the procedure including the risks, benefits and alternatives for the proposed anesthesia with the patient or authorized representative who has indicated his/her understanding and acceptance.   Dental Advisory Given  Plan Discussed with: CRNA and Surgeon  Anesthesia Plan Comments:        Anesthesia Quick Evaluation

## 2013-08-05 NOTE — Anesthesia Postprocedure Evaluation (Signed)
  Anesthesia Post-op Note  Patient: Jamie Schwartz  Procedure(s) Performed: Procedure(s) (LRB): HERNIA REPAIR UMBILICAL ADULT (N/A) INSERTION OF MESH (N/A)  Patient Location: PACU  Anesthesia Type: General  Level of Consciousness: awake and alert   Airway and Oxygen Therapy: Patient Spontanous Breathing  Post-op Pain: mild  Post-op Assessment: Post-op Vital signs reviewed, Patient's Cardiovascular Status Stable, Respiratory Function Stable, Patent Airway and No signs of Nausea or vomiting  Last Vitals:  Filed Vitals:   08/05/13 0945  BP: 122/68  Pulse: 70  Temp: 36.6 C  Resp: 17    Post-op Vital Signs: stable   Complications: No apparent anesthesia complications

## 2013-08-05 NOTE — Telephone Encounter (Signed)
Received a call from [facility], patient's caregiver, who states that patient can't take the prescription that Dr. Gerrit Friends gave him.  She was not with patient and could not answer questions.  I called the patient who stated that he was allergic to Hydrocodone.  I explained to patient that we did not prescribe him Hydrocodone, his prescription is for Oxycodone (Percocet).  Patient states "oh I can take Percocet but I didn't know that was what this was".  Patient is happy with his prescription at this time and agreeable with the prescription.  Dr. Gerrit Friends did offer Tramadol is patient doesn't want the Percocet prescription but patient states no he can take the Percocet and will take that.

## 2013-08-06 NOTE — Op Note (Signed)
NAME:  Jamie Schwartz, Jamie Schwartz NO.:  [id number]  MEDICAL RECORD NO.:  [id number]  LOCATION:  [facility]                         FACILITY:  [facility]  PHYSICIAN:  Velora Heckler, MD      DATE OF BIRTH:  [birth year]  DATE OF PROCEDURE:  08/05/2013                               OPERATIVE REPORT   PREOPERATIVE DIAGNOSIS:  Incarcerated umbilical hernia.  POSTOPERATIVE DIAGNOSIS:  Incarcerated umbilical hernia.  PROCEDURE:  Repair incarcerated umbilical hernia with Bard Ventralex ST mesh patch (6.4 cm).  SURGEON:  Velora Heckler, MD, FACS  ANESTHESIA:  General per Dr. Ronelle Nigh.  ESTIMATED BLOOD LOSS:  Minimal.  PREPARATION:  ChloraPrep.  COMPLICATIONS:  None.  INDICATIONS:  The patient is a 62 year old male who presented during the summer of 2014 with incarcerated umbilical hernia.  The patient now comes to Surgery for repair.  BODY OF REPORT:  Procedure was done in OR #1 at the [facility].  The patient was brought to the operating room, placed in a supine position on the operating room table.  Following administration of general anesthesia, the patient was prepped and draped in usual aseptic fashion.  After ascertaining that an adequate level of anesthesia had been achieved, an infraumbilical incision was made transversely.  Dissection was carried down through subcutaneous tissues using electrocautery for hemostasis.  Hernia sac was identified. Fascial plane was dissected out circumferentially.  Hernia sac was opened.  It contained incarcerated omentum.  Omentum was dissected out of the hernia sac and freed from the undersurface of the umbilicus and reduced back within the peritoneal cavity.  Fascial defect was then defined circumferentially.  It measured approximately 2.5 cm in greatest diameter.  A Bard Ventralex ST mesh patch was selected.  The medium size measuring 6.4 cm was selected.  Patch was prepared and was inserted into the preperitoneal  space.  It was deployed circumferentially.  It was secured to the fascia with interrupted 0 Ethibond simple sutures circumferentially.  Local field block was placed with Marcaine. Umbilicus was re-affixed to the abdominal wall with an interrupted 0 Ethibond suture.  Subcutaneous tissues were closed with interrupted 3-0 Vicryl sutures.  Skin edges were anesthetized with local anesthetic. Skin edges were reapproximated with a running 4-0 Monocryl subcuticular suture.  Wound was washed and dried, and benzoin and Steri-Strips were applied. Sterile dressings were applied.  The patient was awakened from anesthesia and brought to the recovery room.  The patient tolerated the procedure well.   Velora Heckler, MD, [facility] Office: [phone]    TMG/MEDQ  D:  08/05/2013  T:  08/06/2013  Job:  [id number]

## 2013-08-08 ENCOUNTER — Encounter (HOSPITAL_COMMUNITY): Payer: Self-pay | Admitting: Surgery

## 2013-08-08 ENCOUNTER — Telehealth (INDEPENDENT_AMBULATORY_CARE_PROVIDER_SITE_OTHER): Payer: Self-pay | Admitting: *Deleted

## 2013-08-08 ENCOUNTER — Telehealth (INDEPENDENT_AMBULATORY_CARE_PROVIDER_SITE_OTHER): Payer: Self-pay

## 2013-08-08 NOTE — Telephone Encounter (Signed)
LMOM for pt to return my call.  I was calling to inform him of his postop appt with Dr. Gerrit Friends on 08/24/13 with an arrival time of 11:45am.

## 2013-08-08 NOTE — Telephone Encounter (Signed)
Pt caregiver called letting us know pt broke out in a rash over his abdomen where it was prepped with the soap prior to surgery. They started him on benadryl and the rash is starting to go away. Advised to continue benadryl until rash has resolved. They will follow up at scheduled appointment date or call for sooner appt should symptoms worsen or fail to improve.

## 2013-08-22 ENCOUNTER — Encounter (HOSPITAL_COMMUNITY): Payer: Self-pay | Admitting: Surgery

## 2013-08-24 ENCOUNTER — Encounter (INDEPENDENT_AMBULATORY_CARE_PROVIDER_SITE_OTHER): Payer: Self-pay | Admitting: Surgery

## 2013-08-24 ENCOUNTER — Ambulatory Visit (INDEPENDENT_AMBULATORY_CARE_PROVIDER_SITE_OTHER): Payer: BC Managed Care – PPO | Admitting: Surgery

## 2013-08-24 VITALS — BP 132/84 | HR 88 | Temp 98.4°F | Resp 15 | Ht 71.0 in | Wt 262.0 lb

## 2013-08-24 DIAGNOSIS — K420 Umbilical hernia with obstruction, without gangrene: Secondary | ICD-10-CM

## 2013-08-24 NOTE — Patient Instructions (Signed)
  COCOA BUTTER & VITAMIN E CREAM  (Palmer's or other brand)  Apply cocoa butter/vitamin E cream to your incision 2 - 3 times daily.  Massage cream into incision for one minute with each application.  Use sunscreen (50 SPF or higher) for first 6 months after surgery if area is exposed to sun.  You may substitute Mederma or other scar reducing creams as desired.   

## 2013-08-24 NOTE — Progress Notes (Signed)
General Surgery [facility]  Chief Complaint  Patient presents with  . Routine Post Op    umbilical hernia repair with mesh 08/05/2013    HISTORY: Patient is a 63 year old male who underwent repair of umbilical hernia with mesh on 08/05/2013. Postoperative course has been uneventful.  EXAM: Mild to moderate soft tissue swelling at the umbilicus. No sign of infection. No sign of seroma. Without solid there is no sign of recurrence.  IMPRESSION: Status post umbilical hernia repair with mesh  PLAN: Patient will begin to apply topical creams to his incision. He may resume aerobic exercise. I've asked him to avoid strenuous lifting. He'll return in 3-4 weeks for final wound check.  Velora Hecklerodd M. Bela Nyborg, MD, FACS General & Endocrine Surgery [facility]   Visit Diagnoses: 1. Umbilical hernia, incarcerated

## 2013-09-26 ENCOUNTER — Encounter (INDEPENDENT_AMBULATORY_CARE_PROVIDER_SITE_OTHER): Payer: Self-pay | Admitting: Surgery

## 2013-09-26 ENCOUNTER — Ambulatory Visit (INDEPENDENT_AMBULATORY_CARE_PROVIDER_SITE_OTHER): Payer: BC Managed Care – PPO | Admitting: Surgery

## 2013-09-26 DIAGNOSIS — K420 Umbilical hernia with obstruction, without gangrene: Secondary | ICD-10-CM

## 2013-09-26 NOTE — Patient Instructions (Signed)
  COCOA BUTTER & VITAMIN E CREAM  (Palmer's or other brand)  Apply cocoa butter/vitamin E cream to your incision 2 - 3 times daily.  Massage cream into incision for one minute with each application.  Use sunscreen (50 SPF or higher) for first 6 months after surgery if area is exposed to sun.  You may substitute Mederma or other scar reducing creams as desired.   

## 2013-09-26 NOTE — Progress Notes (Signed)
General Surgery [facility]  Chief Complaint  Patient presents with  . Routine Post Op    umbilical hernia repair 08/05/2013    HISTORY: Patient is a 63 year old male who underwent umbilical hernia repair with mesh on 08/05/2013. Postoperative course has been largely uneventful. He returns today for a final wound check.  EXAM: Surgical incision is completely epithelialized. Mild induration and soft tissue edema remains around the umbilical skin. With Valsalva and cough there is no evidence of recurrence.  IMPRESSION: Status post umbilical hernia repair with mesh  PLAN: Patient will continue to apply topical creams this incision. He may resume physical activity. I've asked him to avoid strenuous lifting for an additional 6 weeks.  Patient will return for surgical care as needed.  Velora Hecklerodd M. Manhattan Mccuen, MD, FACS General & Endocrine Surgery [facility]   Visit Diagnoses: 1. Umbilical hernia, incarcerated

## 2014-02-15 ENCOUNTER — Encounter: Payer: Self-pay | Admitting: Cardiovascular Disease

## 2014-02-24 NOTE — Progress Notes (Signed)
Called and [facility] with [place] [facility]. She was advised of Dr. Landry DykeKelly's recommendations. She will place order for mask fit and download to follow.

## 2014-03-27 ENCOUNTER — Telehealth: Payer: Self-pay | Admitting: Cardiovascular Disease

## 2014-03-27 NOTE — Telephone Encounter (Signed)
Patient has an appointment on 04/14/14 with Dr. Tresa EndoKelly for the sleep clinic.  Patient states that he was using a full facial mask at 13.0 and that was not working for him.  The DME changed him to a "nasal pillow" and that is working.  Does he need to keep the appointment on 04/14/14?

## 2014-03-27 NOTE — Telephone Encounter (Signed)
Returned a call to patient to inform him that he will need to keep his sleep appointment with Dr. Tresa EndoKelly on 04/14/14. Explained to patient that he will need to have a face to face compliance appointment on file. Patient voiced understanding and will keep appointrment.

## 2014-03-29 ENCOUNTER — Encounter: Payer: Self-pay | Admitting: Cardiovascular Disease

## 2014-04-14 ENCOUNTER — Encounter: Payer: Self-pay | Admitting: Cardiovascular Disease

## 2014-04-14 ENCOUNTER — Ambulatory Visit (INDEPENDENT_AMBULATORY_CARE_PROVIDER_SITE_OTHER): Payer: BC Managed Care – PPO | Admitting: Cardiovascular Disease

## 2014-04-14 VITALS — BP 157/84 | HR 78 | Ht 71.0 in | Wt 253.2 lb

## 2014-04-14 DIAGNOSIS — G4733 Obstructive sleep apnea (adult) (pediatric): Secondary | ICD-10-CM

## 2014-04-14 DIAGNOSIS — I10 Essential (primary) hypertension: Secondary | ICD-10-CM

## 2014-04-14 DIAGNOSIS — E669 Obesity, unspecified: Secondary | ICD-10-CM

## 2014-04-14 DIAGNOSIS — E668 Other obesity: Secondary | ICD-10-CM

## 2014-04-14 NOTE — Patient Instructions (Signed)
Your physician recommends that you schedule a follow-up appointment As needed for your sleep therapy.

## 2014-04-16 ENCOUNTER — Encounter: Payer: Self-pay | Admitting: Cardiovascular Disease

## 2014-04-16 DIAGNOSIS — E668 Other obesity: Secondary | ICD-10-CM | POA: Insufficient documentation

## 2014-04-16 DIAGNOSIS — G4733 Obstructive sleep apnea (adult) (pediatric): Secondary | ICD-10-CM

## 2014-04-16 DIAGNOSIS — I10 Essential (primary) hypertension: Secondary | ICD-10-CM | POA: Insufficient documentation

## 2014-04-16 HISTORY — DX: Obstructive sleep apnea (adult) (pediatric): G47.33

## 2014-04-16 NOTE — Progress Notes (Signed)
Patient ID: Kennan Detter, male   DOB: Sep 16, 1950, 63 y.o.   MRN: [id number]     HPI: Cougar Imel is a 63 y.o. male who is a patient of Dr. Pollyann Kennedy, and now presents for initial sleep clinic evaluation after recently being diagnosed with obstructive sleep apnea and is now on CPAP therapy.  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  Remotely he had seen Dr. Irven Shelling.  He also has a history of obesity, as well as vitamin D insufficiency.  He was referred by Dr. Pollyann Kennedy for a diagnostic polysomnogram due to complaints of loud snoring, witnessed apnea, gasping for breath, and nonrestorative sleep.  Typically, he goes to bed at 10:30 PM, falls asleep within 30 minutes, and then awakens at 6 AM.  There is no tobacco history.  He rarely drinks alcohol.  On today's sleep evaluation.  He underwent a split night study on 12/30/2013, which revealed severe obstructive sleep apnea.  On the baseline portion of the study his AHI was 61.6 per hour and REM sleep.  This was 60 per hour.  He had significant oxygen desaturation to 79% with non-REM sleep and down to 74% with REM sleep.  There was evidence for loud snoring.  He had a total of 2 central apneas, 81 mixed apneas, 64, obstructive apneas, and 49 hypotony is doing his study.  Because of his severe sleep apnea.  CPAP was initiated at 5 cm and was increased to 13 cm water pressure.  At this pressure AHI was excellent.  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  He was started on CPAP therapy in July and has a ResMed air since 10 CPAP unit set at a fixed pressure of 13 cm.  A download was obtained from July 26 through 04/10/2014 and this shows 100% compliance with 97% of days.  Use greater than 4 hours.  He is averaging 6 hours and 15 minutes per night.  His AHI was 9.0.  Initially, he was using a fullface mask, but more recently, he has had significant additional success  using an AirFit10 P nasal pillow mask.  He states he eats out a lot, and it's difficult for him to control his sodium with reference to food, eating out, as well as weight loss.  Since initiating CPAP therapy, he does subjectively feel improved.  He does work long hours.  He rides a bicycle for exercise and walks his basset hound dog  He is unaware of breakthrough snoring.   Epworth Sleepiness Scale score today in doorstep 12, which is still consistent with hypersomnolence.   Epworth Sleepiness Scale: Situation   Chance of Dozing/Sleeping (0 = never , 1 = slight chance , 2 = moderate chance , 3 = high chance )   sitting and reading 3   watching TV 1   sitting inactive in a public place 2   being a passenger in a motor vehicle for an hour or more 2   lying down in the afternoon 3   sitting and talking to someone 0   sitting quietly after lunch (no alcohol) 1   while stopped for a few minutes in traffic as the driver 0   Total Score  12    Past Medical History  Diagnosis Date  . Hypertension   . GERD (gastroesophageal reflux disease)   . Arthritis   . Asthma     nebulizer  tx used as needed. cortisone inj. for recent respiratory issues.  . Neuromuscular disorder     sciatic nerve pain in butttock-streoid inj. releived- 2 weeks ago  . Cyst of skin and subcutaneous tissue 08-02-13    posterior neck area    Past Surgical History  Procedure Laterality Date  . Tonsillectomy    . Nasal septum surgery    . Wrist fracture surgery    . Umbilical hernia repair N/A 08/05/2013    Procedure: HERNIA REPAIR UMBILICAL ADULT;  Surgeon: Velora Heckler, MD;  Location: [facility];  Service: General;  Laterality: N/A;  . Insertion of mesh N/A 08/05/2013    Procedure: INSERTION OF MESH;  Surgeon: Velora Heckler, MD;  Location: [facility];  Service: General;  Laterality: N/A;  . Hernia repair      Allergies  Allergen Reactions  . Vicodin [Hydrocodone-Acetaminophen] Palpitations    SOB    Current  Outpatient Prescriptions  Medication Sig Dispense Refill  . albuterol (PROVENTIL HFA;VENTOLIN HFA) 108 (90 BASE) MCG/ACT inhaler Inhale 2 puffs into the lungs every 6 (six) hours as needed for wheezing.      . Calcium-Magnesium-Vitamin D (CALCIUM MAGNESIUM PO) Take 1 capsule by mouth daily.      . cholecalciferol (VITAMIN D) 1000 UNITS tablet Take 1,000 Units by mouth daily.      Marland Kitchen GARLIC PO Take 1 tablet by mouth daily.      Marland Kitchen lisinopril (PRINIVIL,ZESTRIL) 20 MG tablet Take 20 mg by mouth every morning.       . Melatonin 3 MG TABS Take 6-9 mg by mouth at bedtime.      . montelukast (SINGULAIR) 10 MG tablet Take 10 mg by mouth daily with breakfast.       . niacin 250 MG tablet Take 250 mg by mouth at bedtime.      . Red Yeast Rice Extract (RED YEAST RICE PO) Take 2 tablets by mouth at bedtime.      . selenium 50 MCG TABS tablet Take 50 mcg by mouth daily.      . sodium chloride (OCEAN) 0.65 % nasal spray Place 1 spray into the nose daily as needed for congestion.      . vitamin C (ASCORBIC ACID) 500 MG tablet Take 500 mg by mouth daily.      . vitamin E (VITAMIN E) 1000 UNIT capsule Take 1,000 Units by mouth daily.       No current facility-administered medications for this visit.    History   Social History  . Marital Status: Single    Spouse Name: N/A    Number of Children: N/A  . Years of Education: N/A   Occupational History  . Not on file.   Social History Main Topics  . Smoking status: Never Smoker   . Smokeless tobacco: Former Neurosurgeon  . Alcohol Use: Yes     Comment: 12 pack/ year  . Drug Use: No  . Sexual Activity: Not Currently   Other Topics Concern  . Not on file   Social History Narrative  . No narrative on file   Socially, he is single and never married.  He does not have children.  He lives by himself.  There is no tobacco history.  He drinks beer very rarely.   ROS General: Negative; No fevers, chills, or night sweats; positive for obesity HEENT: Negative;  No changes in vision or hearing, sinus congestion, difficulty swallowing Pulmonary: Negative; No cough, wheezing, shortness of breath, hemoptysis  Cardiovascular: Negative; No chest pain, presyncope, syncope, palpatations GI: Negative; No nausea, vomiting, diarrhea, or abdominal pain GU: Negative; No dysuria, hematuria, or difficulty voiding Musculoskeletal: Negative; no myalgias, joint pain, or weakness Hematologic: Negative; no easy bruising, bleeding Endocrine: Negative; no heat/cold intolerance Neuro: Negative; no changes in balance, headaches Skin: Negative; No rashes or skin lesions Psychiatric: Negative; No behavioral problems, depression Sleep: See history of present illness; No breakthrough snoring, bruxism, restless legs, hypnogognic hallucinations, no cataplexy   Physical Exam BP 157/84  Pulse 78  Ht  (1.803 m)  Wt 253 lb 3.2 oz (114.851 kg)  BMI 35.33 kg/m2  General: Alert, oriented, no distress.  Skin: normal turgor, no rashes HEENT: Normocephalic, atraumatic. Pupils round and reactive; sclera anicteric; extraocular muscles intact; Fundi mild arterial and her Nose without nasal septal hypertrophy Mouth/Parynx benign; Mallinpatti scale 3 Neck: No JVD, no carotid briuts Lungs: clear to ausculatation and percussion; no wheezing or rales  Chest wall: No tenderness to palpation Heart: RRR, s1 s2 normal; no S3 or S4 gallop.  No significant murmur, rubs, thrills or heaves Abdomen: Moderate central adiposity;soft, nontender; no hepatosplenomehaly, BS+; abdominal aorta nontender and not dilated by palpation. Back: No CVA tenderness Pulses 2+ Extremities: no clubbinbg cyanosis or edema, Homan's sign negative  Neurologic: grossly nonfocal; cranial nerves intact. Psychological: Normal affect and mood.   LABS:  BMET    Component Value Date/Time   NA 139 08/02/2013 1345   K 4.0 08/02/2013 1345   CL 102 08/02/2013 1345   CO2 27 08/02/2013 1345   GLUCOSE 95 08/02/2013  1345   BUN 12 08/02/2013 1345   CREATININE 0.80 08/02/2013 1345   CALCIUM 9.5 08/02/2013 1345   GFRNONAA >90 08/02/2013 1345   GFRAA >90 08/02/2013 1345     Hepatic Function Panel  No results found for this basename: prot, albumin, ast, alt, alkphos, bilitot, bilidir, ibili     CBC    Component Value Date/Time   WBC 6.9 08/02/2013 1345   RBC 4.68 08/02/2013 1345   HGB 14.6 08/02/2013 1345   HCT 43.3 08/02/2013 1345   PLT 254 08/02/2013 1345   MCV 92.5 08/02/2013 1345   MCH 31.2 08/02/2013 1345   MCHC 33.7 08/02/2013 1345   RDW 13.0 08/02/2013 1345   LYMPHSABS 2.5 02/17/2011 0610   MONOABS 0.3 02/17/2011 0610   EOSABS 1.1* 02/17/2011 0610   BASOSABS 0.0 02/17/2011 0610     BNP No results found for this basename: probnp    Lipid Panel  No results found for this basename: chol, trig, hdl, cholhdl, vldl, ldlcalc     RADIOLOGY: No results found.    ASSESSMENT AND PLAN: Mr. Kruze Atchley is a 63 year old gentleman who has a 24 year history of hypertension and was recently found to have severe obstructive sleep apnea with an apnea hypotony index of 60 per hour associated with significant oxygen desaturation to 74%.  He also was noted to have a periodic limb movement during sleep index of 16.8 per hour with 4.7 per hour leading to arousal on the baseline portion of his PSG.  Clinically, he feels significantly improved since initiating CPAP therapy. He is meeting compliance standards with100% usage.  However, his AHI, is still increased at 9.0.  I reviewed of his download and this does not show a significant leak. However, initially had difficulty with a fullface mask and seems to be tolerating therapy better with his nasal pillow mask.  Presently, I have recommended changes to his current therapy  and have increased his initiating pressure from 5 cm to 7 cm. I am also reducing his ramp time and further titrating his CPAP pressure to 14 cm.  A subsequent download will be obtained in  approximately 30 days for reassessment of the above changes.  On CPAP therapy, he is not having any symptomatic restless legs. We discussed the importance of sodium restriction, particularly with his eating out.  He will return to Dr. Pollyann Kennedy for his primary care.  I will see him as needed for sleep clinic followup evaluations.     Lennette Bihari, MD, [facility]  04/16/2014 10:58 AM

## 2015-03-01 ENCOUNTER — Telehealth: Payer: Self-pay | Admitting: *Deleted

## 2015-03-01 NOTE — Telephone Encounter (Signed)
Faxed CPAP supply order to [facility].

## 2015-10-04 ENCOUNTER — Emergency Department (HOSPITAL_COMMUNITY)
Admission: EM | Admit: 2015-10-04 | Discharge: 2015-10-04 | Disposition: A | Discharge status: Home / Self Care | Payer: BLUE CROSS/BLUE SHIELD | Attending: Emergency Medicine | Admitting: Emergency Medicine

## 2015-10-04 ENCOUNTER — Emergency Department (HOSPITAL_COMMUNITY): Payer: BLUE CROSS/BLUE SHIELD

## 2015-10-04 ENCOUNTER — Encounter (HOSPITAL_COMMUNITY): Payer: Self-pay | Admitting: Emergency Medicine

## 2015-10-04 DIAGNOSIS — Z79899 Other long term (current) drug therapy: Secondary | ICD-10-CM | POA: Insufficient documentation

## 2015-10-04 DIAGNOSIS — F419 Anxiety disorder, unspecified: Secondary | ICD-10-CM | POA: Diagnosis not present

## 2015-10-04 DIAGNOSIS — K219 Gastro-esophageal reflux disease without esophagitis: Secondary | ICD-10-CM | POA: Insufficient documentation

## 2015-10-04 DIAGNOSIS — J45909 Unspecified asthma, uncomplicated: Secondary | ICD-10-CM | POA: Insufficient documentation

## 2015-10-04 DIAGNOSIS — I10 Essential (primary) hypertension: Secondary | ICD-10-CM | POA: Diagnosis not present

## 2015-10-04 DIAGNOSIS — R079 Chest pain, unspecified: Secondary | ICD-10-CM | POA: Diagnosis not present

## 2015-10-04 LAB — BASIC METABOLIC PANEL
Anion gap: 11 (ref 5–15)
BUN: 13 mg/dL (ref 6–20)
CO2: 23 mmol/L (ref 22–32)
Calcium: 9.2 mg/dL (ref 8.9–10.3)
Chloride: 106 mmol/L (ref 101–111)
Creatinine, Ser: 0.94 mg/dL (ref 0.61–1.24)
GFR calc Af Amer: >60 mL/min (ref >60)
GFR calc non Af Amer: >60 mL/min (ref >60)
Glucose, Bld: 107 mg/dL — ABNORMAL HIGH (ref 65–99)
Potassium: 3.9 mmol/L (ref 3.5–5.1)
Sodium: 140 mmol/L (ref 135–145)

## 2015-10-04 LAB — CBC
HCT: 47.4 % (ref 39.0–52.0)
Hemoglobin: 16.4 g/dL (ref 13.0–17.0)
MCH: 31.5 pg (ref 26.0–34.0)
MCHC: 34.6 g/dL (ref 30.0–36.0)
MCV: 91.2 fL (ref 78.0–100.0)
Platelets: 201 10*3/uL (ref 150–400)
RBC: 5.2 MIL/uL (ref 4.22–5.81)
RDW: 13.2 % (ref 11.5–15.5)
WBC: 6.3 10*3/uL (ref 4.0–10.5)

## 2015-10-04 LAB — I-STAT TROPONIN, ED
Troponin i, poc: 0 ng/mL (ref 0.00–0.08)
Troponin i, poc: 0 ng/mL (ref 0.00–0.08)

## 2015-10-04 MED ORDER — PANTOPRAZOLE SODIUM 20 MG PO TBEC: 20.0000 mg | DELAYED_RELEASE_TABLET | Freq: Every day | ORAL | Status: DC

## 2015-10-04 MED ORDER — LORAZEPAM 1 MG PO TABS
0.5000 mg | ORAL_TABLET | Freq: Once | ORAL | Status: AC
  Administered 2015-10-04: 0.5 mg via ORAL
  Filled 2015-10-04: qty 1

## 2015-10-04 MED ORDER — GI COCKTAIL ~~LOC~~
30.0000 mL | Freq: Once | ORAL | Status: AC
  Administered 2015-10-04: 30 mL via ORAL
  Filled 2015-10-04: qty 30

## 2015-10-04 MED ORDER — LORAZEPAM 2 MG/ML IJ SOLN
0.5000 mg | Freq: Once | INTRAMUSCULAR | Status: AC
  Administered 2015-10-04: 0.5 mg via INTRAVENOUS
  Filled 2015-10-04: qty 1

## 2015-10-04 MED ORDER — LORAZEPAM 1 MG PO TABS: 0.5000 mg | ORAL_TABLET | Freq: Three times a day (TID) | ORAL | Status: DC | PRN

## 2015-10-04 NOTE — Discharge Instructions (Signed)
Nonspecific Chest Pain  °Chest pain can be caused by many different conditions. There is always a chance that your pain could be related to something serious, such as a heart attack or a blood clot in your lungs. Chest pain can also be caused by conditions that are not life-threatening. If you have chest pain, it is very important to follow up with your health care provider. °CAUSES  °Chest pain can be caused by: °· Heartburn. °· Pneumonia or bronchitis. °· Anxiety or stress. °· Inflammation around your heart (pericarditis) or lung (pleuritis or pleurisy). °· A blood clot in your lung. °· A collapsed lung (pneumothorax). It can develop suddenly on its own (spontaneous pneumothorax) or from trauma to the chest. °· Shingles infection (varicella-zoster virus). °· Heart attack. °· Damage to the bones, muscles, and cartilage that make up your chest wall. This can include: °¨ Bruised bones due to injury. °¨ Strained muscles or cartilage due to frequent or repeated coughing or overwork. °¨ Fracture to one or more ribs. °¨ Sore cartilage due to inflammation (costochondritis). °RISK FACTORS  °Risk factors for chest pain may include: °· Activities that increase your risk for trauma or injury to your chest. °· Respiratory infections or conditions that cause frequent coughing. °· Medical conditions or overeating that can cause heartburn. °· Heart disease or family history of heart disease. °· Conditions or health behaviors that increase your risk of developing a blood clot. °· Having had chicken pox (varicella zoster). °SIGNS AND SYMPTOMS °Chest pain can feel like: °· Burning or tingling on the surface of your chest or deep in your chest. °· Crushing, pressure, aching, or squeezing pain. °· Dull or sharp pain that is worse when you move, cough, or take a deep breath. °· Pain that is also felt in your back, neck, shoulder, or arm, or pain that spreads to any of these areas. °Your chest pain may come and go, or it may stay  constant. °DIAGNOSIS °Lab tests or other studies may be needed to find the cause of your pain. Your health care provider may have you take a test called an ambulatory ECG (electrocardiogram). An ECG records your heartbeat patterns at the time the test is performed. You may also have other tests, such as: °· Transthoracic echocardiogram (TTE). During echocardiography, sound waves are used to create a picture of all of the heart structures and to look at how blood flows through your heart. °· Transesophageal echocardiogram (TEE). This is a more advanced imaging test that obtains images from inside your body. It allows your health care provider to see your heart in finer detail. °· Cardiac monitoring. This allows your health care provider to monitor your heart rate and rhythm in real time. °· Holter monitor. This is a portable device that records your heartbeat and can help to diagnose abnormal heartbeats. It allows your health care provider to track your heart activity for several days, if needed. °· Stress tests. These can be done through exercise or by taking medicine that makes your heart beat more quickly. °· Blood tests. °· Imaging tests. °TREATMENT  °Your treatment depends on what is causing your chest pain. Treatment may include: °· Medicines. These may include: °¨ Acid blockers for heartburn. °¨ Anti-inflammatory medicine. °¨ Pain medicine for inflammatory conditions. °¨ Antibiotic medicine, if an infection is present. °¨ Medicines to dissolve blood clots. °¨ Medicines to treat coronary artery disease. °· Supportive care for conditions that do not require medicines. This may include: °¨ Resting. °¨ Applying heat   or cold packs to injured areas.  Limiting activities until pain decreases. HOME CARE INSTRUCTIONS  If you were prescribed an antibiotic medicine, finish it all even if you start to feel better.  Avoid any activities that bring on chest pain.  Do not use any tobacco products, including  cigarettes, chewing tobacco, or electronic cigarettes. If you need help quitting, ask your health care provider.  Do not drink alcohol.  Take medicines only as directed by your health care provider.  Keep all follow-up visits as directed by your health care provider. This is important. This includes any further testing if your chest pain does not go away.  If heartburn is the cause for your chest pain, you may be told to keep your head raised (elevated) while sleeping. This reduces the chance that acid will go from your stomach into your esophagus.  Make lifestyle changes as directed by your health care provider. These may include:  Getting regular exercise. Ask your health care provider to suggest some activities that are safe for you.  Eating a heart-healthy diet. A registered dietitian can help you to learn healthy eating options.  Maintaining a healthy weight.  Managing diabetes, if necessary.  Reducing stress. SEEK MEDICAL CARE IF:  Your chest pain does not go away after treatment.  You have a rash with blisters on your chest.  You have a fever. SEEK IMMEDIATE MEDICAL CARE IF:   Your chest pain is worse.  You have an increasing cough, or you cough up blood.  You have severe abdominal pain.  You have severe weakness.  You faint.  You have chills.  You have sudden, unexplained chest discomfort.  You have sudden, unexplained discomfort in your arms, back, neck, or jaw.  You have shortness of breath at any time.  You suddenly start to sweat, or your skin gets clammy.  You feel nauseous or you vomit.  You suddenly feel light-headed or dizzy.  Your heart begins to beat quickly, or it feels like it is skipping beats. These symptoms may represent a serious problem that is an emergency. Do not wait to see if the symptoms will go away. Get medical help right away. Call your local emergency services (911 in the [place] Do not drive yourself to the hospital.   This  information is not intended to replace advice given to you by your health care provider. Make sure you discuss any questions you have with your health care provider.  Follow up with your primary care provider for re-evaluation. Return to the ED if you experience severe worsening of your symptoms, increased chest pain, difficulty breathing, loss of consciousness, numbness/tingling in extremities.

## 2015-10-04 NOTE — ED Provider Notes (Signed)
CSN: [id number]     Arrival date & time 10/04/15  4098 History   First MD Initiated Contact with Patient 10/04/15 [phone]     Chief Complaint  Patient presents with  . Chest Pain     (Consider location/radiation/quality/duration/timing/severity/associated sxs/prior Treatment) HPI   Jamie Schwartz is a 65 y.o M with a pmhx of HTN, OSA, GERD who presents to the emergency department today complaining of chest tightness. Patient states that over the last 2-3 weeks he has experienced intermittent "twitching" across his chest. Patient describes them as feeling like electrical impulses on the surface of his skin. No known triggers. Symptoms are not associated with exertion or rest. No aggravating or alleviating factors. Patient states that he was seen at urgent care for this 2 weeks ago and had an EKG done which was normal. However they recommended that he go to an emergency department because his age was a risk factor. Patient states that the wait was too long so he went home. Patient states that he has been very anxious about this ever since. Last night when he was going to bed patient states that he felt a "thump" in the center of his chest that lasted for 1 second so he decided to come to the emergency department. Denies chest pain, shortness of breath, paresthesias, diaphoresis, nausea, vomiting, dizziness, syncope. Patient states that he has a long history of acid reflux and currently feels like he has a sour stomach. Patient took a baby aspirin before he went to bed and was given the full dose en route by EMS. No personal or family history of heart disease. No tobacco use. Patient currently requesting to take something for his anxiety.  Past Medical History  Diagnosis Date  . Hypertension   . GERD (gastroesophageal reflux disease)   . Arthritis   . Asthma     nebulizer tx used as needed. cortisone inj. for recent respiratory issues.  . Neuromuscular disorder (HCC)     sciatic nerve pain in  butttock-streoid inj. releived- 2 weeks ago  . Cyst of skin and subcutaneous tissue 08-02-13    posterior neck area  . Severe obstructive sleep apnea 04/16/2014   Past Surgical History  Procedure Laterality Date  . Tonsillectomy    . Nasal septum surgery    . Wrist fracture surgery    . Umbilical hernia repair N/A 08/05/2013    Procedure: HERNIA REPAIR UMBILICAL ADULT;  Surgeon: Velora Heckler, MD;  Location: [facility];  Service: General;  Laterality: N/A;  . Insertion of mesh N/A 08/05/2013    Procedure: INSERTION OF MESH;  Surgeon: Velora Heckler, MD;  Location: [facility];  Service: General;  Laterality: N/A;  . Hernia repair     Family History  Problem Relation Age of Onset  . Brain cancer Mother     tumor  . Alzheimer's disease Father    Social History  Substance Use Topics  . Smoking status: Never Smoker   . Smokeless tobacco: Former Neurosurgeon  . Alcohol Use: Yes     Comment: 12 pack/ year    Review of Systems  All other systems reviewed and are negative.     Allergies  Vicodin  Home Medications   Prior to Admission medications   Medication Sig Start Date End Date Taking? Authorizing Provider  albuterol (PROVENTIL HFA;VENTOLIN HFA) 108 (90 BASE) MCG/ACT inhaler Inhale 2 puffs into the lungs every 6 (six) hours as needed for wheezing.    Historical Provider, MD  Calcium-Magnesium-Vitamin  D (CALCIUM MAGNESIUM PO) Take 1 capsule by mouth daily.    Historical Provider, MD  cholecalciferol (VITAMIN D) 1000 UNITS tablet Take 1,000 Units by mouth daily.    Historical Provider, MD  GARLIC PO Take 1 tablet by mouth daily.    Historical Provider, MD  lisinopril (PRINIVIL,ZESTRIL) 20 MG tablet Take 20 mg by mouth every morning.     Historical Provider, MD  Melatonin 3 MG TABS Take 6-9 mg by mouth at bedtime.    Historical Provider, MD  montelukast (SINGULAIR) 10 MG tablet Take 10 mg by mouth daily with breakfast.     Historical Provider, MD  niacin 250 MG tablet Take 250 mg by mouth at  bedtime.    Historical Provider, MD  Red Yeast Rice Extract (RED YEAST RICE PO) Take 2 tablets by mouth at bedtime.    Historical Provider, MD  selenium 50 MCG TABS tablet Take 50 mcg by mouth daily.    Historical Provider, MD  sodium chloride (OCEAN) 0.65 % nasal spray Place 1 spray into the nose daily as needed for congestion.    Historical Provider, MD  vitamin C (ASCORBIC ACID) 500 MG tablet Take 500 mg by mouth daily.    Historical Provider, MD  vitamin E (VITAMIN E) 1000 UNIT capsule Take 1,000 Units by mouth daily.    Historical Provider, MD   BP 139/77 mmHg  Pulse 78  Temp(Src) 98.3 F (36.8 C) (Oral)  Resp 20  SpO2 98% Physical Exam  Constitutional: He is oriented to person, place, and time. He appears well-developed and well-nourished. No distress.  HENT:  Head: Normocephalic and atraumatic.  Mouth/Throat: No oropharyngeal exudate.  Eyes: Conjunctivae and EOM are normal. Pupils are equal, round, and reactive to light. Right eye exhibits no discharge. Left eye exhibits no discharge. No scleral icterus.  Neck: Normal range of motion. Neck supple. No JVD present.  Cardiovascular: Normal rate, regular rhythm, normal heart sounds and intact distal pulses.  Exam reveals no gallop and no friction rub.   No murmur heard. Pulmonary/Chest: Effort normal and breath sounds normal. No stridor. No respiratory distress. He has no wheezes. He has no rales. He exhibits no tenderness.  Abdominal: Soft. Bowel sounds are normal. He exhibits no distension and no mass. There is no tenderness. There is no rebound and no guarding.  Musculoskeletal: Normal range of motion. He exhibits no edema.  Lymphadenopathy:    He has no cervical adenopathy.  Neurological: He is alert and oriented to person, place, and time. No cranial nerve deficit. He exhibits normal muscle tone.  Strength 5/5 throughout. No sensory deficits.  No gait abnormality.  Skin: Skin is warm and dry. No rash noted. He is not  diaphoretic. No erythema. No pallor.  Psychiatric: He has a normal mood and affect. His behavior is normal.  Nursing note and vitals reviewed.   ED Course  Procedures (including critical care time) Labs Review Labs Reviewed  BASIC METABOLIC PANEL - Abnormal; Notable for the following:    Glucose, Bld 107 (*)    All other components within normal limits  CBC  I-STAT TROPOININ, ED    Imaging Review Dg Chest 2 View  10/04/2015  CLINICAL DATA:  Chest pain for 2 weeks. EXAM: CHEST  2 VIEW COMPARISON:  04/06/2013 FINDINGS: The heart size and mediastinal contours are within normal limits. Both lungs are clear. No pleural effusion or pneumothorax. Bony thorax is intact. IMPRESSION: No active cardiopulmonary disease. Electronically Signed   By: Onalee Hua  Ormond M.D.   On: 10/04/2015 08:27   I have personally reviewed and evaluated these images and lab results as part of my medical decision-making.   EKG Interpretation   Date/Time:  Thursday October 04 2015 07:47:46 EST Ventricular Rate:  78 PR Interval:  140 QRS Duration: 87 QT Interval:  380 QTC Calculation: 433 R Axis:   39 Text Interpretation:  Normal sinus rhythm no acute ST/T changes Normal ECG  no significant change since 2014 Confirmed by GOLDSTON  MD, SCOTT ([id number])  on 10/04/2015 7:55:22 AM      MDM   Final diagnoses:  Chest pain, unspecified chest pain type  Anxiety   65 y.o m with pmhx of HTN presents to the ED today c/o intermittent "twitching" in his chest over the last 3 weeks and last night pt had 1 second episode of feeling a "thump" in the center of his chest. No CP in ED. Pt appears well. No diaphoresis. Pt had an EKG done 2 weeks ago for same symptoms which was normal. He was advised to go to ED due to his age but did not because of ED wait times. Pt states he has been nervous about having a heart attack since then.Pt very anxious in ED and requesting something for anxiety. No personal or family hx of heart disease.  No tobacco use. Pt with hx of GERD and also c/o "sour stomach". Will give ativan and GI cocktail. Pt received 325 ASA by EMS.   Pt with much improvement after Ativan and GI cocktail. Patient is to be discharged with recommendation to follow up with PCP in regards to today's hospital visit. Chest pain is not likely of cardiac or pulmonary etiology d/t presentation, perc negative, VSS, no tracheal deviation, no JVD or new murmur, RRR, breath sounds equal bilaterally, EKG without acute abnormalities, negative serial troponin, and negative CXR. Low HEART score. Low suspicion ACS. Pt has been advised start a PPI and return to the ED is CP becomes exertional, associated with diaphoresis or nausea, radiates to left jaw/arm, worsens or becomes concerning in any way. Pt appears reliable for follow up and is agreeable to discharge.   Case has been discussed with Dr. Criss Alvine who agrees with the above plan to discharge.      Lester Kinsman [place], PA-C 10/04/15 1215  Pricilla Loveless, MD 10/05/15 [phone]

## 2015-10-04 NOTE — ED Notes (Signed)
Patient transported to X-ray 

## 2015-10-04 NOTE — ED Notes (Addendum)
Pt from home via [facility] with c/o a "thumb" in his chest that woke him up from sleep at approx 615 am.  Pt denies any pain since initally being woken up.  Ambulatory to ambulance.  12 lead unremarkable.  EMS reports pt has been anxious during transport.  Given 324 mg aspirin.  NAD, A&O.

## 2019-09-05 ENCOUNTER — Other Ambulatory Visit: Payer: Self-pay

## 2019-09-05 ENCOUNTER — Ambulatory Visit (INDEPENDENT_AMBULATORY_CARE_PROVIDER_SITE_OTHER): Payer: Medicare HMO | Admitting: Cardiovascular Disease

## 2019-09-05 ENCOUNTER — Encounter: Payer: Self-pay | Admitting: Cardiovascular Disease

## 2019-09-05 VITALS — BP 156/90 | HR 83 | Temp 97.4°F | Ht 71.0 in | Wt 249.4 lb

## 2019-09-05 DIAGNOSIS — I10 Essential (primary) hypertension: Secondary | ICD-10-CM

## 2019-09-05 DIAGNOSIS — G4733 Obstructive sleep apnea (adult) (pediatric): Secondary | ICD-10-CM

## 2019-09-05 DIAGNOSIS — Z79899 Other long term (current) drug therapy: Secondary | ICD-10-CM

## 2019-09-05 DIAGNOSIS — R400 Somnolence: Secondary | ICD-10-CM

## 2019-09-05 NOTE — Progress Notes (Signed)
Patient ID: Jamie Schwartz, male   DOB: [birth year], 69 y.o.   MRN: [id number]     HPI: Jamie Schwartz is a 69 y.o. male who is a patient of Dr. Pollyann Kennedy.  I saw him for initial sleep evaluation in August 2015 and have not seen him since. He presents today to reestablish care with me following recent malfunction of his CPAP machine.  Mr. Jamie Schwartz has a history of hypertension, which initiated at age 36.  Remotely he had seen Dr. Irven Shelling.  He also has a history of obesity, as well as vitamin D insufficiency.  In 2015, he was referred by Dr. Pollyann Kennedy for a diagnostic polysomnogram due to complaints of loud snoring, witnessed apnea, gasping for breath, and nonrestorative sleep.  Typically, he goes to bed at 10:30 PM, falls asleep within 30 minutes, and then awakens at 6 AM.  There is no tobacco history.  He rarely drinks alcohol.   He underwent a split night study on 12/30/2013, which revealed severe obstructive sleep apnea.  On the baseline portion of the study his AHI was 61.6 per hour and REM sleep.  This was 60 per hour.  He had significant oxygen desaturation to 79% with non-REM sleep and down to 74% with REM sleep.  There was evidence for loud snoring.  He had a total of 2 central apneas, 81 mixed apneas, 64, obstructive apneas, and 49 hypotony is doing his study.  Because of his severe sleep apnea.  CPAP was initiated at 5 cm and was increased to 13 cm water pressure.  At this pressure AHI was excellent.  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  He was started on CPAP therapy in July2015 and has a ResMed AirSense 10 CPAP unit set at a fixed pressure of 13 cm.  A download was obtained from July 26 through 04/10/2014 showed 100% compliance with 97% of days.  Use greater than 4 hours.  He is averaging 6 hours and 15 minutes per night.  His AHI was 9.0.  Initially, he was using a fullface mask, but more recently, he has had significant additional success using an AirFit10  P nasal pillow mask.  He states he eats out a lot, and it's difficult for him to control his sodium with reference to food, eating out, as well as weight loss.  Since initiating CPAP therapy, he does subjectively feel improved.  He does work long hours.  He rides a bicycle for exercise and walks his basset hound dog  He is unaware of breakthrough snoring.   When seen during my evaluation in 2015 is Epworth Sleepiness Scale score endorsed at 12, which is still consistent with hypersomnolence.   Epworth Sleepiness Scale: Situation   Chance of Dozing/Sleeping (0 = never , 1 = slight chance , 2 = moderate chance , 3 = high chance )   sitting and reading 3   watching TV 1   sitting inactive in a public place 2   being a passenger in a motor vehicle for an hour or more 2   lying down in the afternoon 3   sitting and talking to someone 0   sitting quietly after lunch (no alcohol) 1   while stopped for a few minutes in traffic as the driver 0   Total Score  12    Since initiating CPAP therapy, Jamie Schwartz states that he is essentially 100% compliant.  At most he may miss 1 or 2  nights per year of CPAP use.  He typically sleeps at least 7 hours per night.  Most recently, he has been working at [employer] and he therefore goes to bed at 7 PM and wakes up at 3 AM.  He is at work at [employer] AM and works until [employer] AM.  He had felt significantly improved on CPAP therapy.  However, over the past month his CPAP unit has malfunction.  He potentially was in need to try to obtain parts.  He had contacted [employer] to his now his DME company since [facility].  However since he had not been evaluated in several years a repeat sleep evaluation was recommended prior to potentially obtaining parts or a new machine.  Over the past month, he is not sleeping well.  He is very tired during the day.  His sleep is nonrestorative.  His mouth is very dry.  He finds that he is almost falling asleep while driving and  his symptoms have significantly worsened the longer he is not been on his CPAP therapy.  A new Epworth Sleepiness Scale score was calculated in the office today and this endorsed at 8.  He presents to reestablish care and for evaluation.  Past Medical History:  Diagnosis Date  . Arthritis   . Asthma    nebulizer tx used as needed. cortisone inj. for recent respiratory issues.  . Cyst of skin and subcutaneous tissue 08-02-13   posterior neck area  . GERD (gastroesophageal reflux disease)   . Hypertension   . Neuromuscular disorder (HCC)    sciatic nerve pain in butttock-streoid inj. releived- 2 weeks ago  . Severe obstructive sleep apnea 04/16/2014    Past Surgical History:  Procedure Laterality Date  . HERNIA REPAIR    . INSERTION OF MESH N/A 08/05/2013   Procedure: INSERTION OF MESH;  Surgeon: Earnstine Regal, MD;  Location: [facility];  Service: General;  Laterality: N/A;  . NASAL SEPTUM SURGERY    . TONSILLECTOMY    . UMBILICAL HERNIA REPAIR N/A 08/05/2013   Procedure: HERNIA REPAIR UMBILICAL ADULT;  Surgeon: Earnstine Regal, MD;  Location: [facility];  Service: General;  Laterality: N/A;  . WRIST FRACTURE SURGERY      Allergies  Allergen Reactions  . Codeine   . Hydrocodone   . Vicodin [Hydrocodone-Acetaminophen] Palpitations    SOB    Current Outpatient Medications  Medication Sig Dispense Refill  . albuterol (ACCUNEB) 1.25 MG/3ML nebulizer solution albuterol sulfate 1.25 mg/3 mL solution for nebulization    . ALPRAZolam (XANAX) 0.5 MG tablet Take by mouth.    . Calcium-Magnesium-Vitamin D (CALCIUM MAGNESIUM PO) Take 1 capsule by mouth daily.    . cholecalciferol (VITAMIN D) 1000 UNITS tablet Take 1,000 Units by mouth daily.    . Flaxseed, Linseed, (FLAXSEED OIL PO) Take 1 capsule by mouth daily.    Marland Kitchen GARLIC PO Take 1 tablet by mouth daily.    Marland Kitchen lisinopril (PRINIVIL,ZESTRIL) 20 MG tablet Take 20 mg by mouth every morning.     Marland Kitchen LORazepam (ATIVAN) 1 MG tablet Take 0.5 tablets (0.5  mg total) by mouth 3 (three) times daily as needed for anxiety. 5 tablet 0  . Melatonin 3 MG TABS Take 6-9 mg by mouth at bedtime.    . montelukast (SINGULAIR) 10 MG tablet Take 10 mg by mouth daily with breakfast.     . naproxen (NAPROSYN) 500 MG tablet naproxen 500 mg tablet  TAKE 1 TABLET BY  MOUTH TWICE A DAY FOR 15 DAYS    . niacin 250 MG tablet Take 250 mg by mouth at bedtime.    . Omega-3 Fatty Acids (FISH OIL PO) Take 1 capsule by mouth daily.    Marland Kitchen omeprazole (PRILOSEC) 20 MG capsule omeprazole 20 mg capsule,delayed release    . pantoprazole (PROTONIX) 20 MG tablet Take 1 tablet (20 mg total) by mouth daily. 30 tablet 0  . Red Yeast Rice Extract (RED YEAST RICE PO) Take 2 tablets by mouth at bedtime.    . selenium 50 MCG TABS tablet Take 50 mcg by mouth daily.    . sodium chloride (OCEAN) 0.65 % nasal spray Place 1 spray into the nose daily as needed for congestion.    . sodium fluoride (PREVIDENT) 1.1 % GEL dental gel PreviDent 5000 Booster Plus 1.1 % dental paste    . tamsulosin (FLOMAX) 0.4 MG CAPS capsule Take 0.4 mg by mouth daily.    Marland Kitchen triamcinolone cream (KENALOG) 0.1 % triamcinolone acetonide 0.1 % topical cream    . vitamin C (ASCORBIC ACID) 500 MG tablet Take 500 mg by mouth daily.    . vitamin E (VITAMIN E) 1000 UNIT capsule Take 1,000 Units by mouth 2 (two) times daily.      No current facility-administered medications for this visit.    Social History   Socioeconomic History  . Marital status: Single    Spouse name: Not on file  . Number of children: Not on file  . Years of education: Not on file  . Highest education level: Not on file  Occupational History  . Not on file  Tobacco Use  . Smoking status: Never Smoker  . Smokeless tobacco: Former Engineer, water and Sexual Activity  . Alcohol use: Yes    Comment: 12 pack/ year  . Drug use: No  . Sexual activity: Not Currently  Other Topics Concern  . Not on file  Social History Narrative  . Not on file    Social Determinants of Health   Financial Resource Strain:   . Difficulty of Paying Living Expenses: Not on file  Food Insecurity:   . Worried About Programme researcher, broadcasting/film/video in the Last Year: Not on file  . Ran Out of Food in the Last Year: Not on file  Transportation Needs:   . Lack of Transportation (Medical): Not on file  . Lack of Transportation (Non-Medical): Not on file  Physical Activity:   . Days of Exercise per Week: Not on file  . Minutes of Exercise per Session: Not on file  Stress:   . Feeling of Stress : Not on file  Social Connections:   . Frequency of Communication with Friends and Family: Not on file  . Frequency of Social Gatherings with Friends and Family: Not on file  . Attends Religious Services: Not on file  . Active Member of Clubs or Organizations: Not on file  . Attends Banker Meetings: Not on file  . Marital Status: Not on file  Intimate Partner Violence:   . Fear of Current or Ex-Partner: Not on file  . Emotionally Abused: Not on file  . Physically Abused: Not on file  . Sexually Abused: Not on file   Socially, he is single and never married.  He does not have children.  He lives by himself.  There is no tobacco history.  He drinks beer very rarely.  He recently started a job at [employer] requiring very early morning  work.   ROS General: Negative; No fevers, chills, or night sweats; positive for obesity HEENT: Negative; No changes in vision or hearing, sinus congestion, difficulty swallowing Pulmonary: Negative; No cough, wheezing, shortness of breath, hemoptysis Cardiovascular: Negative; No chest pain, presyncope, syncope, palpatations GI: Negative; No nausea, vomiting, diarrhea, or abdominal pain GU: Negative; No dysuria, hematuria, or difficulty voiding Musculoskeletal: Negative; no myalgias, joint pain, or weakness Hematologic: Negative; no easy bruising, bleeding Endocrine: Negative; no heat/cold intolerance Neuro: Negative; no changes  in balance, headaches Skin: Negative; No rashes or skin lesions Psychiatric: Negative; No behavioral problems, depression Sleep: See history of present illness;   Physical Exam BP (!) 156/90   Pulse 83   Temp (!) 97.4 F (36.3 C)   Ht 5\' 11"  (1.803 m)   Wt 249 lb 6.4 oz (113.1 kg)   SpO2 97%   BMI 34.78 kg/m    Repeat blood pressure by me was 138/84.  Wt Readings from Last 3 Encounters:  09/05/19 249 lb 6.4 oz (113.1 kg)  04/14/14 253 lb 3.2 oz (114.9 kg)  08/24/13 262 lb (118.8 kg)   General: Alert, oriented, no distress.  Skin: normal turgor, no rashes, warm and dry, bearded HEENT: Normocephalic, atraumatic. Pupils equal round and reactive to light; sclera anicteric; extraocular muscles intact;  Nose without nasal septal hypertrophy Mouth/Parynx benign; Mallinpatti scale 2/3 Neck: No JVD, no carotid bruits; normal carotid upstroke Lungs: clear to ausculatation and percussion; no wheezing or rales Chest wall: without tenderness to palpitation Heart: PMI not displaced, RRR, s1 s2 normal, 1/6 systolic murmur, no diastolic murmur, no rubs, gallops, thrills, or heaves Abdomen: soft, nontender; no hepatosplenomehaly, BS+; abdominal aorta nontender and not dilated by palpation. Back: no CVA tenderness Pulses 2+ Musculoskeletal: full range of motion, normal strength, no joint deformities Extremities: no clubbing cyanosis or edema, Homan's sign negative  Neurologic: grossly nonfocal; Cranial nerves grossly wnl Psychologic: Normal mood and affect   ECG (independently read by me): Normal sinus rhythm at 79 bpm.  Normal intervals.  No ectopy   LABS:  BMP Latest Ref Rng & Units 10/04/2015 08/02/2013 04/06/2013  Glucose 65 - 99 mg/dL 132(G107(H) 95 401(U105(H)  BUN 6 - 20 mg/dL 13 12 14   Creatinine 0.61 - 1.24 mg/dL 2.720.94 5.360.80 6.440.91  Sodium 135 - 145 mmol/L 140 139 138  Potassium 3.5 - 5.1 mmol/L 3.9 4.0 4.1  Chloride 101 - 111 mmol/L 106 102 102  CO2 22 - 32 mmol/L 23 27 30   Calcium 8.9  - 10.3 mg/dL 9.2 9.5 9.7   CBC Latest Ref Rng & Units 10/04/2015 08/02/2013 04/06/2013  WBC 4.0 - 10.5 K/uL 6.3 6.9 6.1  Hemoglobin 13.0 - 17.0 g/dL 03.416.4 74.214.6 59.515.4  Hematocrit 39.0 - 52.0 % 47.4 43.3 44.6  Platelets 150 - 400 K/uL 201 254 242   Lipid Panel  No results found for: CHOL, TRIG, HDL, CHOLHDL, VLDL, LDLCALC, LDLDIRECT, LABVLDL   RADIOLOGY: No results found.  IMPRESSION: 1. OSA (obstructive sleep apnea)   2. Essential hypertension   3. Medication management   4. Daytime sleepiness     ASSESSMENT AND PLAN: Mr. Nona DellRonald Smaltz is a 69 year old gentleman who has a longstanding history of hypertension for almost 30 years and most recently has been on lisinopril 20 mg daily.  He is followed by Dr. Pollyann KennedyMassenburg at urgent care.  In 2015 he was found to have severe obstructive sleep apnea with an AHI of 60/h associated with significant oxygen desaturation to a nadir of 74% during  REM sleep.  He initiated CPAP therapy in July 2015 and has been exceptionally compliant over the past 5-1/2 years.  He states he would rarely ever miss a night and perhaps only missed 2 nights of not using CPAP.  He was doing well but unfortunately, his 24-1/2-year-old ResMed air sense 10 unit has now malfunctioned.  I discussed with him that he would qualify for a new machine.  Is my recommendation that he have a ResMed air sense 10 auto CPAP unit rather than a set machine and we will send a prescription to [facility] who now is his DME company.  He had previously requested some parts for his broken machine and it would be worthwhile to obtain those parts following verification of his compliance which I have done today.  He has had some issues with the nasal pillow mask causing nasal irritation requiring Vaseline.  As result I will also recommend changing his mask to a ResMed AirFit N 30i style which I think would benefit him well.  His blood pressure today is elevated and I suspect this is contributed by not using CPAP over  the past month.  I have recommended slight titration of lisinopril to 30 mg.  He tells me his cholesterol was checked over the past recently by Dr. Pollyann Kennedy.  With his increase in ACE in addition I am recommending follow-up chemistry profile and will also check a CBC and TSH in light of his fatigability.  Again discussed the importance of continued CPAP use and adverse consequences of untreated sleep apnea if left untreated.  I will see him in the office in approximately 2 to 3 months following his getting either a new machine or his old machine fixed for follow-up evaluation.  Lennette Bihari, MD, [facility]  09/05/2019 7:17 PM

## 2019-09-05 NOTE — Patient Instructions (Signed)
Medication Instructions:  Continue current medications  *If you need a refill on your cardiac medications before your next appointment, please call your pharmacy*  Lab Work: CBC, CMP and TSH  If you have labs (blood work) drawn today and your tests are completely normal, you will receive your results only by: Marland Kitchen MyChart Message (if you have MyChart) OR . A paper copy in the mail If you have any lab test that is abnormal or we need to change your treatment, we will call you to review the results.  Testing/Procedures: None Ordered  Follow-Up: At [facility], you and your health needs are our priority.  As part of our continuing mission to provide you with exceptional heart care, we have created designated Provider Care Teams.  These Care Teams include your primary Cardiologist (physician) and Advanced Practice Providers (APPs -  Physician Assistants and Nurse Practitioners) who all work together to provide you with the care you need, when you need it.  Your next appointment:   3 month(s)  The format for your next appointment:   In Person  Provider:   Nicki Guadalajara, MD

## 2019-09-06 ENCOUNTER — Other Ambulatory Visit: Payer: Self-pay | Admitting: Cardiovascular Disease

## 2019-09-06 DIAGNOSIS — G4733 Obstructive sleep apnea (adult) (pediatric): Secondary | ICD-10-CM

## 2019-09-06 DIAGNOSIS — Z9989 Dependence on other enabling machines and devices: Secondary | ICD-10-CM

## 2019-09-09 LAB — COMPREHENSIVE METABOLIC PANEL
ALT: 24 IU/L (ref 0–44)
AST: 21 IU/L (ref 0–40)
Albumin/Globulin Ratio: 2.3 — ABNORMAL HIGH (ref 1.2–2.2)
Albumin: 4.5 g/dL (ref 3.8–4.8)
Alkaline Phosphatase: 69 IU/L (ref 39–117)
BUN/Creatinine Ratio: 24 (ref 10–24)
BUN: 22 mg/dL (ref 8–27)
Bilirubin Total: 0.4 mg/dL (ref 0.0–1.2)
CO2: 22 mmol/L (ref 20–29)
Calcium: 9.4 mg/dL (ref 8.6–10.2)
Chloride: 105 mmol/L (ref 96–106)
Creatinine, Ser: 0.93 mg/dL (ref 0.76–1.27)
GFR calc Af Amer: 99 mL/min/{1.73_m2} (ref >59)
GFR calc non Af Amer: 85 mL/min/{1.73_m2} (ref >59)
Globulin, Total: 2 g/dL (ref 1.5–4.5)
Glucose: 111 mg/dL — ABNORMAL HIGH (ref 65–99)
Potassium: 4.5 mmol/L (ref 3.5–5.2)
Sodium: 142 mmol/L (ref 134–144)
Total Protein: 6.5 g/dL (ref 6.0–8.5)

## 2019-09-09 LAB — CBC
Hematocrit: 43.1 % (ref 37.5–51.0)
Hemoglobin: 15.4 g/dL (ref 13.0–17.7)
MCH: 33.1 pg — ABNORMAL HIGH (ref 26.6–33.0)
MCHC: 35.7 g/dL (ref 31.5–35.7)
MCV: 93 fL (ref 79–97)
Platelets: 219 10*3/uL (ref 150–450)
RBC: 4.65 x10E6/uL (ref 4.14–5.80)
RDW: 12.6 % (ref 11.6–15.4)
WBC: 9.7 10*3/uL (ref 3.4–10.8)

## 2019-09-09 LAB — TSH: TSH: 1.49 u[IU]/mL (ref 0.450–4.500)

## 2019-09-12 ENCOUNTER — Encounter: Payer: Self-pay | Admitting: Cardiovascular Disease

## 2019-09-19 ENCOUNTER — Telehealth: Payer: Self-pay | Admitting: Cardiovascular Disease

## 2019-09-19 NOTE — Telephone Encounter (Signed)
Patient said he has nor received his new CPAP machine yet. He has not heard anything from [facility] or Dr. Landry Dyke office. He will be calling [employer] next

## 2019-09-19 NOTE — Telephone Encounter (Signed)
Patient is calling stating he has still not received his CPAP machine. He is requesting it be reordered from [employer] in [place] fax #:[phone].

## 2019-09-20 NOTE — Telephone Encounter (Signed)
Returned a call to the patient. Informed him I am attempting to get the needed documentation to get his new machine. I need the initial 2015 face to face note from the referring provider. He will also go by [facility] to see if he can get the note.  S/N: I never received a request to order a new machine from when the patient was seen in the office.

## 2020-01-20 ENCOUNTER — Telehealth (INDEPENDENT_AMBULATORY_CARE_PROVIDER_SITE_OTHER): Payer: Medicare HMO | Admitting: Cardiovascular Disease

## 2020-01-20 VITALS — Ht 71.0 in | Wt 243.0 lb

## 2020-01-20 DIAGNOSIS — G4733 Obstructive sleep apnea (adult) (pediatric): Secondary | ICD-10-CM

## 2020-01-20 DIAGNOSIS — R5383 Other fatigue: Secondary | ICD-10-CM | POA: Diagnosis not present

## 2020-01-20 DIAGNOSIS — I10 Essential (primary) hypertension: Secondary | ICD-10-CM

## 2020-01-20 MED ORDER — LISINOPRIL 20 MG PO TABS: 30.0000 mg | ORAL_TABLET | Freq: Every day | ORAL | 3 refills | Status: DC

## 2020-01-20 NOTE — Progress Notes (Signed)
Patient ID: Jamie Schwartz, male   DOB: 06/11/1951, 69 y.o.   MRN: [id number]     HPI: Jamie Schwartz is a 69 y.o. male who is a patient of Dr. Pollyann Kennedy.  I saw him for initial sleep evaluation in August 2015 and have not seen him since. I saw him on September 05, 2019 when he reestablish care with me following recent malfunction of his CPAP machine.  Jamie Schwartz has a history of hypertension, which initiated at age 64.  Remotely he had seen Dr. Irven Shelling.  He also has a history of obesity, as well as vitamin D insufficiency.  In 2015, he was referred by Dr. Pollyann Kennedy for a diagnostic polysomnogram due to complaints of loud snoring, witnessed apnea, gasping for breath, and nonrestorative sleep.  Typically, he goes to bed at 10:30 PM, falls asleep within 30 minutes, and then awakens at 6 AM.  There is no tobacco history.  He rarely drinks alcohol.   He underwent a split night study on 12/30/2013, which revealed severe obstructive sleep apnea.  On the baseline portion of the study his AHI was 61.6 per hour and REM sleep.  This was 60 per hour.  He had significant oxygen desaturation to 79% with non-REM sleep and down to 74% with REM sleep.  There was evidence for loud snoring.  He had a total of 2 central apneas, 81 mixed apneas, 64, obstructive apneas, and 49 hypotony is doing his study.  Because of his severe sleep apnea.  CPAP was initiated at 5 cm and was increased to 13 cm water pressure.  At this pressure AHI was excellent.  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  He was started on CPAP therapy in July2015 and has a ResMed AirSense 10 CPAP unit set at a fixed pressure of 13 cm.  A download was obtained from July 26 through 04/10/2014 showed 100% compliance with 97% of days.  Use greater than 4 hours.  He is averaging 6 hours and 15 minutes per night.  His AHI was 9.0.  Initially, he was using a fullface mask, but more recently, he has had significant additional success  using an AirFit10 P nasal pillow mask.  He states he eats out a lot, and it's difficult for him to control his sodium with reference to food, eating out, as well as weight loss.  Since initiating CPAP therapy, he does subjectively feel improved.  He does work long hours.  He rides a bicycle for exercise and walks his basset hound dog  He is unaware of breakthrough snoring.   When seen during my evaluation in 2015 is Epworth Sleepiness Scale score endorsed at 12, which is still consistent with hypersomnolence.   Epworth Sleepiness Scale: Situation   Chance of Dozing/Sleeping (0 = never , 1 = slight chance , 2 = moderate chance , 3 = high chance )   sitting and reading 3   watching TV 1   sitting inactive in a public place 2   being a passenger in a motor vehicle for an hour or more 2   lying down in the afternoon 3   sitting and talking to someone 0   sitting quietly after lunch (no alcohol) 1   while stopped for a few minutes in traffic as the driver 0   Total Score  12    Since initiating CPAP therapy, Jamie Schwartz states that he is essentially 100% compliant.  At most he  may miss 1 or 2 nights per year of CPAP use.  He typically sleeps at least 7 hours per night.  Most recently, he has been working at [employer] and he therefore goes to bed at 7 PM and wakes up at 3 AM.  He is at work at [employer] AM and works until [employer] AM.  He had felt significantly improved on CPAP therapy.  However, over the past month his CPAP unit has malfunction.  He potentially was in need to try to obtain parts.  He had contacted [employer] which is now his DME company since their takeover of [facility].  However since he had not been evaluated in several years a repeat sleep evaluation was recommended prior to potentially obtaining parts or a new machine.  Over the past month, he is not sleeping well.  He is very tired during the day.  His sleep is nonrestorative.  His mouth is very dry.  He finds that he is almost falling  asleep while driving and his symptoms have significantly worsened the longer he is not been on his CPAP therapy.  A new Epworth Sleepiness Scale score was calculated in the office today and this endorsed at 8.    When I saw him I recommended that he would qualify for a new ResMed air sense 10 CPAP auto unit.  He was having issues with his nasal pillow mask causing nasal irritation and I suggested trying the ResMed AirFit N 30i style.  It appears now that he is using [employer] as his DME company.  A download was obtained from Dec 21, 2019 through January 19, 2020 which shows that he is meeting compliance with 90 days of usage.  Average usage is 5 hours and 30 minutes which meets compliance but I discussed with him that this is inadequate sleep duration.  He typically goes to bed between 1030 and 11 at night.  He had been walking and working at [employer] but recently quit that job due to issues with his feet and plantar fasciitis.  His blood pressure has been elevated.  Several weeks ago he was fatigued had lost his sense of taste and was concerned about Covid but testing was negative.  He presents for evaluation.  Past Medical History:  Diagnosis Date   Arthritis    Asthma    nebulizer tx used as needed. cortisone inj. for recent respiratory issues.   Cyst of skin and subcutaneous tissue 08-02-13   posterior neck area   GERD (gastroesophageal reflux disease)    Hypertension    Neuromuscular disorder (HCC)    sciatic nerve pain in [place]-streoid inj. releived- 2 weeks ago   Severe obstructive sleep apnea 04/16/2014    Past Surgical History:  Procedure Laterality Date   HERNIA REPAIR     INSERTION OF MESH N/A 08/05/2013   Procedure: INSERTION OF MESH;  Surgeon: Velora Heckler, MD;  Location: [facility];  Service: General;  Laterality: N/A;   NASAL SEPTUM SURGERY     TONSILLECTOMY     UMBILICAL HERNIA REPAIR N/A 08/05/2013   Procedure: HERNIA REPAIR UMBILICAL ADULT;  Surgeon: Velora Heckler, MD;   Location: [facility];  Service: General;  Laterality: N/A;   WRIST FRACTURE SURGERY      Allergies  Allergen Reactions   Codeine    Hydrocodone    Vicodin [Hydrocodone-Acetaminophen] Palpitations    SOB    Current Outpatient Medications  Medication Sig Dispense Refill   albuterol (ACCUNEB) 1.25 MG/3ML nebulizer solution  albuterol sulfate 1.25 mg/3 mL solution for nebulization     ALPRAZolam (XANAX) 0.5 MG tablet Take by mouth.     Calcium-Magnesium-Vitamin D (CALCIUM MAGNESIUM PO) Take 1 capsule by mouth daily.     cholecalciferol (VITAMIN D) 1000 UNITS tablet Take 1,000 Units by mouth daily.     Flaxseed, Linseed, (FLAXSEED OIL PO) Take 1 capsule by mouth daily.     GARLIC PO Take 1 tablet by mouth daily.     lisinopril (PRINIVIL,ZESTRIL) 20 MG tablet Take 20 mg by mouth every morning.      LORazepam (ATIVAN) 1 MG tablet Take 0.5 tablets (0.5 mg total) by mouth 3 (three) times daily as needed for anxiety. 5 tablet 0   Melatonin 3 MG TABS Take 6-9 mg by mouth at bedtime.     montelukast (SINGULAIR) 10 MG tablet Take 10 mg by mouth daily with breakfast.      naproxen (NAPROSYN) 500 MG tablet naproxen 500 mg tablet  TAKE 1 TABLET BY MOUTH TWICE A DAY FOR 15 DAYS     niacin 250 MG tablet Take 250 mg by mouth at bedtime.     Omega-3 Fatty Acids (FISH OIL PO) Take 1 capsule by mouth daily.     omeprazole (PRILOSEC) 20 MG capsule omeprazole 20 mg capsule,delayed release     pantoprazole (PROTONIX) 20 MG tablet Take 1 tablet (20 mg total) by mouth daily. 30 tablet 0   Red Yeast Rice Extract (RED YEAST RICE PO) Take 2 tablets by mouth at bedtime.     selenium 50 MCG TABS tablet Take 50 mcg by mouth daily.     sodium chloride (OCEAN) 0.65 % nasal spray Place 1 spray into the nose daily as needed for congestion.     sodium fluoride (PREVIDENT) 1.1 % GEL dental gel PreviDent 5000 Booster Plus 1.1 % dental paste     tamsulosin (FLOMAX) 0.4 MG CAPS capsule Take 0.4 mg by  mouth daily.     triamcinolone cream (KENALOG) 0.1 % triamcinolone acetonide 0.1 % topical cream     vitamin C (ASCORBIC ACID) 500 MG tablet Take 500 mg by mouth daily.     vitamin E (VITAMIN E) 1000 UNIT capsule Take 1,000 Units by mouth 2 (two) times daily.      No current facility-administered medications for this visit.    Social History   Socioeconomic History   Marital status: Single    Spouse name: Not on file   Number of children: Not on file   Years of education: Not on file   Highest education level: Not on file  Occupational History   Not on file  Tobacco Use   Smoking status: Never Smoker   Smokeless tobacco: Former Neurosurgeon  Substance and Sexual Activity   Alcohol use: Yes    Comment: 12 pack/ year   Drug use: No   Sexual activity: Not Currently  Other Topics Concern   Not on file  Social History Narrative   Not on file   Social Determinants of Health   Financial Resource Strain:    Difficulty of Paying Living Expenses:   Food Insecurity:    Worried About Programme researcher, broadcasting/film/video in the Last Year:    Barista in the Last Year:   Transportation Needs:    Freight forwarder (Medical):    Lack of Transportation (Non-Medical):   Physical Activity:    Days of Exercise per Week:    Minutes of Exercise per Session:  Stress:    Feeling of Stress :   Social Connections:    Frequency of Communication with Friends and Family:    Frequency of Social Gatherings with Friends and Family:    Attends Religious Services:    Active Member of Clubs or Organizations:    Attends Archivist Meetings:    Marital Status:   Intimate Partner Violence:    Fear of Current or Ex-Partner:    Emotionally Abused:    Physically Abused:    Sexually Abused:    Socially, he is single and never married.  He does not have children.  He lives by himself.  There is no tobacco history.  He drinks beer very rarely.  He recently started a job  at [employer] requiring very early morning work.   ROS General: Negative; No fevers, chills, or night sweats; positive for obesity HEENT: Negative; No changes in vision or hearing, sinus congestion, difficulty swallowing Pulmonary: Negative; No cough, wheezing, shortness of breath, hemoptysis Cardiovascular: Negative; No chest pain, presyncope, syncope, palpatations GI: Negative; No nausea, vomiting, diarrhea, or abdominal pain GU: Negative; No dysuria, hematuria, or difficulty voiding Musculoskeletal:  Hematologic: Negative; no easy bruising, bleeding Endocrine: Negative; no heat/cold intolerance Neuro: Negative; no changes in balance, headaches Skin: Negative; No rashes or skin lesions Psychiatric: Negative; No behavioral problems, depression Sleep: See history of present illness;   Physical Exam Ht 5\' 11"  (1.803 m)    Wt 243 lb (110.2 kg)    BMI 33.89 kg/m    Repeat blood pressure by me was 138/84.  Wt Readings from Last 3 Encounters:  01/20/20 243 lb (110.2 kg)  09/05/19 249 lb 6.4 oz (113.1 kg)  04/14/14 253 lb 3.2 oz (114.9 kg)   General: Alert, oriented, no distress.  Skin: normal turgor, no rashes, warm and dry, bearded HEENT: Normocephalic, atraumatic. Pupils equal round and reactive to light; sclera anicteric; extraocular muscles intact;  Nose without nasal septal hypertrophy Mouth/Parynx benign; Mallinpatti scale 2/3 Neck: No JVD, no carotid bruits; normal carotid upstroke Lungs: clear to ausculatation and percussion; no wheezing or rales Chest wall: without tenderness to palpitation Heart: PMI not displaced, RRR, s1 s2 normal, 1/6 systolic murmur, no diastolic murmur, no rubs, gallops, thrills, or heaves Abdomen: soft, nontender; no hepatosplenomehaly, BS+; abdominal aorta nontender and not dilated by palpation. Back: no CVA tenderness Pulses 2+ Musculoskeletal: full range of motion, normal strength, no joint deformities Extremities: no clubbing cyanosis or edema,  Homan's sign negative  Neurologic: grossly nonfocal; Cranial nerves grossly wnl Psychologic: Normal mood and affect   ECG (independently read by me): Normal sinus rhythm at 79 bpm.  Normal intervals.  No ectopy   LABS:  BMP Latest Ref Rng & Units 09/09/2019 10/04/2015 08/02/2013  Glucose 65 - 99 mg/dL 111(H) 107(H) 95  BUN 8 - 27 mg/dL 22 13 12   Creatinine 0.76 - 1.27 mg/dL 0.93 0.94 0.80  BUN/Creat Ratio 10 - 24 24 - -  Sodium 134 - 144 mmol/L 142 140 139  Potassium 3.5 - 5.2 mmol/L 4.5 3.9 4.0  Chloride 96 - 106 mmol/L 105 106 102  CO2 20 - 29 mmol/L 22 23 27   Calcium 8.6 - 10.2 mg/dL 9.4 9.2 9.5   CBC Latest Ref Rng & Units 09/09/2019 10/04/2015 08/02/2013  WBC 3.4 - 10.8 x10E3/uL 9.7 6.3 6.9  Hemoglobin 13.0 - 17.7 g/dL 15.4 16.4 14.6  Hematocrit 37.5 - 51.0 % 43.1 47.4 43.3  Platelets 150 - 450 x10E3/uL 219 201 254  Lipid Panel  No results found for: CHOL, TRIG, HDL, CHOLHDL, VLDL, LDLCALC, LDLDIRECT, LABVLDL   RADIOLOGY: No results found.  IMPRESSION: No diagnosis found.  ASSESSMENT AND PLAN: Jamie Schwartz is a 69 year old gentleman who has a longstanding history of hypertension for almost 30 years and most recently has been on lisinopril 20 mg daily.  He is followed by Dr. Pollyann KennedyMassenburg at urgent care.  In 2015 he was found to have severe obstructive sleep apnea with an AHI of 60/h associated with significant oxygen desaturation to a nadir of 74% during REM sleep.  He initiated CPAP therapy in July 2015 and has been exceptionally compliant over the past 5-1/2 years.  He states he would rarely ever miss a night and perhaps only missed 2 nights of not using CPAP.  He was doing well but unfortunately, his 315-1/69-year-old ResMed air sense 10 unit has now malfunctioned.  I discussed with him that he would qualify for a new machine.  Is my recommendation that he have a ResMed air sense 10 auto CPAP unit rather than a set machine and we will send a prescription to [employer] who now is  his DME company.  He had previously requested some parts fo  r his broken machine and it would be worthwhile to obtain those parts following verification of his compliance which I have done today.  He has had some issues with the nasal pillow mask causing nasal irritation requiring Vaseline.  As result I will also recommend changing his mask to a ResMed AirFit N 30i style which I think would benefit him well.  His blood pressure today is elevated and I suspect this is contributed by not using CPAP over the past month.  I have recommended slight titration of lisinopril to 30 mg.  He tells me his cholesterol was checked over the past recently by Dr. Pollyann KennedyMassenburg.  With his increase in ACE in addition I am recommending follow-up chemistry profile and will also check a CBC and TSH in light of his fatigability.  Again discussed the importance of continued CPAP use and adverse consequences of untreated sleep apnea if left untreated.  I will see him in the office in approximately 2 to 3 months following his getting either a new machine or his old machine fixed for follow-up evaluation.  Lennette Biharihomas A. Destiney Sanabia, MD, [facility]  01/20/2020 3:08 PM

## 2020-01-20 NOTE — Patient Instructions (Addendum)
Medication Instructions:  INCREASE YOUR LISINOPRIL TO 30MG  DAILY *If you need a refill on your cardiac medications before your next appointment, please call your pharmacy*   Follow-Up: At [facility], you and your health needs are our priority.  As part of our continuing mission to provide you with exceptional heart care, we have created designated Provider Care Teams.  These Care Teams include your primary Cardiologist (physician) and Advanced Practice Providers (APPs -  Physician Assistants and Nurse Practitioners) who all work together to provide you with the care you need, when you need it.  We recommend signing up for the patient portal called "MyChart".  Sign up information is provided on this After Visit Summary.  MyChart is used to connect with patients for Virtual Visits (Telemedicine).  Patients are able to view lab/test results, encounter notes, upcoming appointments, etc.  Non-urgent messages can be sent to your provider as well.   To learn more about what you can do with MyChart, go to [facility].    Your next appointment:   05/29/20 at 8:00 am  The format for your next appointment:   In Person  Provider:   07/29/20, MD

## 2020-01-22 ENCOUNTER — Encounter: Payer: Self-pay | Admitting: Cardiovascular Disease

## 2020-01-22 NOTE — Progress Notes (Signed)
Virtual Visit via Telephone Note   This visit type was conducted due to national recommendations for restrictions regarding the COVID-19 Pandemic (e.g. social distancing) in an effort to limit this patient's exposure and mitigate transmission in our community.  Due to his co-morbid illnesses, this patient is at least at moderate risk for complications without adequate follow up.  This format is felt to be most appropriate for this patient at this time.  The patient did not have access to video technology/had technical difficulties with video requiring transitioning to audio format only (telephone).  All issues noted in this document were discussed and addressed.  No physical exam could be performed with this format.  Please refer to the patient's chart for his  consent to telehealth for Jamie Schwartz.   The patient was identified using 2 identifiers.  Date:  01/22/2020   ID:  Jamie Schwartz, DOB 15-Sep-1950, MRN [id number]  Patient Location: Home Provider Location: Office  PCP:  Jamie Converse, PA-C  Cardiologist:  Jamie Guadalajara, MD Electrophysiologist:  None   Evaluation Performed:  Follow-Up Visit  Chief Complaint:  5 month F/U  History of Present Illness:    Jamie Schwartz is a 69 y.o. male  who is a patient of Dr. Pollyann Schwartz.  I saw him for initial sleep evaluation in August 2015 and have not seen him since. I saw him on September 05, 2019 when he reestablish care with me following recent malfunction of his CPAP machine.  He presents for a 31-month follow-up evaluation.  Jamie Schwartz has a history of hypertension, which initiated at age 58.  Remotely he had seen Dr. Irven Schwartz.  He also has a history of obesity, as well as vitamin D insufficiency.  In 2015, he was referred by Dr. Pollyann Schwartz for a diagnostic polysomnogram due to complaints of loud snoring, witnessed apnea, gasping for breath, and nonrestorative sleep.  Typically, he goes to bed at 10:30 PM, falls asleep within 30 minutes, and  then awakens at 6 AM.  There is no tobacco history.  He rarely drinks alcohol.   He underwent a split night study on 12/30/2013, which revealed severe obstructive sleep apnea.  On the baseline portion of the study his AHI was 61.6 per hour and REM sleep.  This was 60 per hour.  He had significant oxygen desaturation to 79% with non-REM sleep and down to 74% with REM sleep.  There was evidence for loud snoring.  He had a total of 2 central apneas, 81 mixed apneas, 64, obstructive apneas, and 49 hypotony is doing his study.  Because of his severe sleep apnea.  CPAP was initiated at 5 cm and was increased to 13 cm water pressure.  At this pressure AHI was excellent.  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  He was started on CPAP therapy in July2015 and has a ResMed AirSense 10 CPAP unit set at a fixed pressure of 13 cm.  A download was obtained from July 26 through 04/10/2014 showed 100% compliance with 97% of days.  Use greater than 4 hours.  He is averaging 6 hours and 15 minutes per night.  His AHI was 9.0.  Initially, he was using a fullface mask, but more recently, he has had significant additional success using an AirFit10 P nasal pillow mask.  He states he eats out a lot, and it's difficult for him to control his sodium with reference to food, eating out, as well as weight loss.  Since initiating CPAP therapy, he does subjectively feel improved.  He does work long hours.  He rides a bicycle for exercise and walks his basset hound dog  He is unaware of breakthrough snoring.   When seen during my evaluation in 2015 is Epworth Sleepiness Scale score endorsed at 12, which is still consistent with hypersomnolence.   Epworth Sleepiness Scale: Situation   Chance of Dozing/Sleeping (0 = never , 1 = slight chance , 2 = moderate chance , 3 = high chance )   sitting and reading 3   watching TV 1   sitting inactive in a public place 2   being a passenger in a motor vehicle  for an hour or more 2   lying down in the afternoon 3   sitting and talking to someone 0   sitting quietly after lunch (no alcohol) 1   while stopped for a few minutes in traffic as the driver 0   Total Score  12    Since initiating CPAP therapy, Jamie Schwartz states that he is essentially 100% compliant.  At most he may miss 1 or 2 nights per year of CPAP use.  He typically sleeps at least 7 hours per night.  Most recently, he has been working at [employer] and he therefore goes to bed at 7 PM and wakes up at 3 AM.  He is at work at [employer] AM and works until [employer] AM.  He had felt significantly improved on CPAP therapy.  However, over the past month his CPAP unit has malfunction.  He potentially was in need to try to obtain parts.  He had contacted [employer] which is now his DME company since their takeover of [facility].  However since he had not been evaluated in several years a repeat sleep evaluation was recommended prior to potentially obtaining parts or a new machine.  Over the past month, he is not sleeping well.  He is very tired during the day.  His sleep is nonrestorative.  His mouth is very dry.  He finds that he is almost falling asleep while driving and his symptoms have significantly worsened the longer he is not been on his CPAP therapy.  A new Epworth Sleepiness Scale score was calculated in the office today and this endorsed at 8.    When I saw him I recommended that he would qualify for a new ResMed air sense 10 CPAP auto unit.  He was having issues with his nasal pillow mask causing nasal irritation and I suggested trying the ResMed AirFit N 30i style.  It appears now that he is using [employer] as his DME company.  A download was obtained from Dec 21, 2019 through January 19, 2020 which shows that he is meeting compliance with 90 days of usage.  Average usage is 5 hours and 30 minutes which meets compliance but I discussed with him that this is inadequate sleep duration.  He typically goes to bed  between 1030 and 11 at night.  He had been walking and working at [employer] but recently quit that job due to issues with his feet and plantar fasciitis.  His blood pressure has been elevated.  Several weeks ago he was fatigued had lost his sense of taste and was concerned about Covid but testing was negative.  He presents for evaluation.   The patient does not have symptoms concerning for COVID-19 infection (fever, chills, cough, or new shortness of breath).    Past Medical History:  Diagnosis Date  . Arthritis   . Asthma    nebulizer tx used as needed. cortisone inj. for recent respiratory issues.  . Cyst of skin and subcutaneous tissue 08-02-13   posterior neck area  . GERD (gastroesophageal reflux disease)   . Hypertension   . Neuromuscular disorder (HCC)    sciatic nerve pain in butttock-streoid inj. releived- 2 weeks ago  . Severe obstructive sleep apnea 04/16/2014   Past Surgical History:  Procedure Laterality Date  . HERNIA REPAIR    . INSERTION OF MESH N/A 08/05/2013   Procedure: INSERTION OF MESH;  Surgeon: Velora Hecklerodd M Gerkin, MD;  Location: [facility];  Service: General;  Laterality: N/A;  . NASAL SEPTUM SURGERY    . TONSILLECTOMY    . UMBILICAL HERNIA REPAIR N/A 08/05/2013   Procedure: HERNIA REPAIR UMBILICAL ADULT;  Surgeon: Velora Hecklerodd M Gerkin, MD;  Location: [facility];  Service: General;  Laterality: N/A;  . WRIST FRACTURE SURGERY       Current Meds  Medication Sig  . albuterol (ACCUNEB) 1.25 MG/3ML nebulizer solution albuterol sulfate 1.25 mg/3 mL solution for nebulization  . ALPRAZolam (XANAX) 0.5 MG tablet Take by mouth.  . Calcium-Magnesium-Vitamin D (CALCIUM MAGNESIUM PO) Take 1 capsule by mouth daily.  . cholecalciferol (VITAMIN D) 1000 UNITS tablet Take 1,000 Units by mouth daily.  . Flaxseed, Linseed, (FLAXSEED OIL PO) Take 1 capsule by mouth daily.  Marland Kitchen. GARLIC PO Take 1 tablet by mouth daily.  Marland Kitchen. LORazepam (ATIVAN) 1 MG tablet Take 0.5 tablets (0.5 mg total) by mouth 3 (three)  times daily as needed for anxiety.  . Melatonin 3 MG TABS Take 6-9 mg by mouth at bedtime.  . montelukast (SINGULAIR) 10 MG tablet Take 10 mg by mouth daily with breakfast.   . naproxen (NAPROSYN) 500 MG tablet naproxen 500 mg tablet  TAKE 1 TABLET BY MOUTH TWICE A DAY FOR 15 DAYS  . niacin 250 MG tablet Take 250 mg by mouth at bedtime.  . Omega-3 Fatty Acids (FISH OIL PO) Take 1 capsule by mouth daily.  Marland Kitchen. omeprazole (PRILOSEC) 20 MG capsule omeprazole 20 mg capsule,delayed release  . pantoprazole (PROTONIX) 20 MG tablet Take 1 tablet (20 mg total) by mouth daily.  . Red Yeast Rice Extract (RED YEAST RICE PO) Take 2 tablets by mouth at bedtime.  . selenium 50 MCG TABS tablet Take 50 mcg by mouth daily.  . sodium chloride (OCEAN) 0.65 % nasal spray Place 1 spray into the nose daily as needed for congestion.  . sodium fluoride (PREVIDENT) 1.1 % GEL dental gel PreviDent 5000 Booster Plus 1.1 % dental paste  . tamsulosin (FLOMAX) 0.4 MG CAPS capsule Take 0.4 mg by mouth daily.  Marland Kitchen. triamcinolone cream (KENALOG) 0.1 % triamcinolone acetonide 0.1 % topical cream  . vitamin C (ASCORBIC ACID) 500 MG tablet Take 500 mg by mouth daily.  . vitamin E (VITAMIN E) 1000 UNIT capsule Take 1,000 Units by mouth 2 (two) times daily.   . [DISCONTINUED] lisinopril (PRINIVIL,ZESTRIL) 20 MG tablet Take 20 mg by mouth every morning.      Allergies:   Codeine, Hydrocodone, and Vicodin [hydrocodone-acetaminophen]   Social History   Tobacco Use  . Smoking status: Never Smoker  . Smokeless tobacco: Former Engineer, waterUser  Substance Use Topics  . Alcohol use: Yes    Comment: 12 pack/ year  . Drug use: No     Family Hx: The patient's family history includes Alzheimer's disease in his father; Brain cancer in his  mother.  ROS:   Please see the history of present illness.    No fevers chills night sweats 2 weeks previously he had lost his sense of taste and was concerned about possible Covid.  He tested Covid negative. No  wheezing No chest pain No abdominal pain Positive for plantar fasciitis Positive for mild blood pressure elevation Positive for OSA, receive new CPAP machine, on therapy. All other systems reviewed and are negative.   Prior CV studies:   The following studies were reviewed today:  I obtained a new download in the office from Dec 21, 2019 through January 19, 2020.  He is meeting compliance standards with 90% of usage days.  Average usage is 5 hours and 30 minutes.  His new ResMed air sense 10 is set at a minimum pressure of 6 and maximum pressure of 20.  AHI is increased at 18.2.  There is occasional mask leak.  Labs/Other Tests and Data Reviewed:    EKG: Since this was a virtual visit, no ECG was obtained today.  However I personally reviewed the ECG from August 28, 2019 which showed normal sinus rhythm at 79 bpm, normal intervals and no ectopy.  Recent Labs: 09/09/2019: ALT 24; BUN 22; Creatinine, Ser 0.93; Hemoglobin 15.4; Platelets 219; Potassium 4.5; Sodium 142; TSH 1.490   Recent Lipid Panel No results found for: CHOL, TRIG, HDL, CHOLHDL, LDLCALC, LDLDIRECT  Wt Readings from Last 3 Encounters:  01/20/20 243 lb (110.2 kg)  09/05/19 249 lb 6.4 oz (113.1 kg)  04/14/14 253 lb 3.2 oz (114.9 kg)     Objective:    Vital Signs:  Ht 5\' 11"  (1.803 m)   Wt 243 lb (110.2 kg)   BMI 33.89 kg/m    Since this was a virtual exam I could not physically examine the patient. He states his blood pressures have been typically running in the 135/85-90 range Breathing was normal and not labored There was no audible wheezing No chest wall tenderness No awareness of palpitations or heart rate irregularity No abdominal pain Plantar fasciitis Sleeping better with the new CPAP machine.   ASSESSMENT & PLAN:    1.  OSA: Mr. Herd received a new ResMed air sense 10 CPAP auto machine.  He is now meeting compliance standards on most recent download.  He has been      set at a minimum pressure of 6  and maximum pressure of 20.  AHI is increased       at 8.2 with an apnea index of 17.0 and hypopnea index of 1.2.  He had a central       apnea index of 10.2 on his download.  I am changing his minimum EPAP                   pressure to 9 cm.  I discussed optimal sleep duration at 8 hours since he is  only sleeping 5 hours and 30 minutes.  I discussed sleep duration less than 6        hours has been shown to have contribute to increased mortality.  We also              discussed its effects on cognitive function.  Even though [employer] is his DME company, he would like to get a mask through [facility].  I recommended that he contact them and show them which mask he prefers and then we can call in the prescription for him to receive this mask.  2.  Essentialentral hypertension.  Blood pressure recordings are consistent with stage I hypertension.  He has been on lisinopril 20 mg daily.  I have recommended further titration to 30 mg with target LDL less than 130/80.  3.  Fatigability: Improved.  TSH level 1.49 on recent laboratory  COVID-19 Education: The signs and symptoms of COVID-19 were discussed with the patient and how to seek care for testing (follow up with PCP or arrange E-visit).  The importance of social distancing was discussed today.  Time:   Today, I have spent 20 minutes with the patient with telehealth technology discussing the above problems.     Medication Adjustments/Labs and Tests Ordered: Current medicines are reviewed at length with the patient today.  Concerns regarding medicines are outlined above.   Tests Ordered: No orders of the defined types were placed in this encounter.   Medication Changes: Meds ordered this encounter  Medications  . lisinopril (ZESTRIL) 20 MG tablet    Sig: Take 1.5 tablets (30 mg total) by mouth daily.    Dispense:  135 tablet    Refill:  3    Follow Up: 4 months in office  Signed, Jamie Guadalajara, MD  01/22/2020 5:18 PM    [facility]

## 2020-05-09 ENCOUNTER — Telehealth: Payer: Self-pay

## 2020-05-09 NOTE — Telephone Encounter (Signed)
Download from cpap machine showed AHI of 14.2 Per Dr.Kelly- "increase pressure to 11-20  And get a download in 30 days. May need bipap titration" Will send message to sleep coordinator to initiate changes.

## 2020-05-29 ENCOUNTER — Encounter: Payer: Self-pay | Admitting: Cardiovascular Disease

## 2020-05-29 ENCOUNTER — Ambulatory Visit: Payer: Medicare HMO | Admitting: Cardiovascular Disease

## 2020-05-29 ENCOUNTER — Other Ambulatory Visit: Payer: Self-pay

## 2020-05-29 VITALS — BP 140/83 | HR 61 | Temp 97.0°F | Ht 71.0 in | Wt 245.6 lb

## 2020-05-29 DIAGNOSIS — I10 Essential (primary) hypertension: Secondary | ICD-10-CM

## 2020-05-29 DIAGNOSIS — E668 Other obesity: Secondary | ICD-10-CM

## 2020-05-29 DIAGNOSIS — Z79899 Other long term (current) drug therapy: Secondary | ICD-10-CM

## 2020-05-29 DIAGNOSIS — G4733 Obstructive sleep apnea (adult) (pediatric): Secondary | ICD-10-CM

## 2020-05-29 NOTE — Progress Notes (Signed)
Patient ID: Jamie Schwartz, male   DOB: 03-21-1951, 69 y.o.   MRN: [id number]     HPI: Jamie Schwartz is a 69 y.o. male who is a patient of Dr. Pollyann Kennedy.  I saw him for initial sleep evaluation in August 2015 and have not seen him since. He presents today to reestablish care with me following recent malfunction of his CPAP machine.  Jamie Schwartz has a history of hypertension, which initiated at age 89.  Remotely he had seen Dr. Irven Shelling.  He also has a history of obesity, as well as vitamin D insufficiency.  In 2015, he was referred by Dr. Pollyann Kennedy for a diagnostic polysomnogram due to complaints of loud snoring, witnessed apnea, gasping for breath, and nonrestorative sleep.  Typically, he goes to bed at 10:30 PM, falls asleep within 30 minutes, and then awakens at 6 AM.  There is no tobacco history.  He rarely drinks alcohol.   He underwent a split night study on 12/30/2013, which revealed severe obstructive sleep apnea.  On the baseline portion of the study his AHI was 61.6 per hour and REM sleep.  This was 60 per hour.  He had significant oxygen desaturation to 79% with non-REM sleep and down to 74% with REM sleep.  There was evidence for loud snoring.  He had a total of 2 central apneas, 81 mixed apneas, 64, obstructive apneas, and 49 hypotony is doing his study.  Because of his severe sleep apnea.  CPAP was initiated at 5 cm and was increased to 13 cm water pressure.  At this pressure AHI was excellent.  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  He was started on CPAP therapy in July 2015 and has a ResMed AirSense 10 CPAP unit set at a fixed pressure of 13 cm.  A download was obtained from July 26 through 04/10/2014 showed 100% compliance with 97% of days.  Use greater than 4 hours.  He is averaging 6 hours and 15 minutes per night.  His AHI was 9.0.  Initially, he was using a fullface mask, but more recently, he has had significant additional success using an AirFit10  P nasal pillow mask.  He states he eats out a lot, and it's difficult for him to control his sodium with reference to food, eating out, as well as weight loss.  Since initiating CPAP therapy, he does subjectively feel improved.  He does work long hours.  He rides a bicycle for exercise and walks his basset hound dog  He is unaware of breakthrough snoring.   When seen during my evaluation in 2015 is Epworth Sleepiness Scale score endorsed at 12, which is still consistent with hypersomnolence.   Epworth Sleepiness Scale: Situation   Chance of Dozing/Sleeping (0 = never , 1 = slight chance , 2 = moderate chance , 3 = high chance )   sitting and reading 3   watching TV 1   sitting inactive in a public place 2   being a passenger in a motor vehicle for an hour or more 2   lying down in the afternoon 3   sitting and talking to someone 0   sitting quietly after lunch (no alcohol) 1   while stopped for a few minutes in traffic as the driver 0   Total Score  12    Since initiating CPAP therapy, Jamie Schwartz states that he is essentially 100% compliant.  At most he may miss 1 or  2 nights per year of CPAP use.  He typically sleeps at least 7 hours per night.  Most recently, he has been working at [employer] and he therefore goes to bed at 7 PM and wakes up at 3 AM.  He is at work at [employer] AM and works until [employer] AM.  He had felt significantly improved on CPAP therapy.  However, over the past month his CPAP unit has malfunction.  He potentially was in need to try to obtain parts.  He had contacted [employer] to his now his DME company since their takeover of [facility].  However since he had not been evaluated in several years a repeat sleep evaluation was recommended prior to potentially obtaining parts or a new machine.  Over the past month, he is not sleeping well.  He is very tired during the day.  His sleep is nonrestorative.  His mouth is very dry.  He finds that he is almost falling asleep while driving and  his symptoms have significantly worsened the longer he is not been on his CPAP therapy.  A new Epworth Sleepiness Scale score was calculated in the office today and this endorsed at 8.    When I saw him I felt that he would qualify for a new ResMed air sense 10 CPAP auto unit.  He was having issues with his nasal pillow mask causing nasal irritation and I suggested trying the ResMed AirFit N 30i style.  I saw him in a telemedicine evaluation in June 2021.  He was now using [employer] as his DME company.  A download was obtained from Dec 21, 2019 through January 19, 2020 which shows that he is meeting compliance with 90 days of usage.  Average usage is 5 hours and 30 minutes which meets compliance but I discussed with him that this is inadequate sleep duration.  He typically goes to bed between 10:30 and 11 at night.  He had been walking and working at [employer] but recently quit that job due to issues with his feet and plantar fasciitis.  His blood pressure has been elevated.  Several weeks prior to that evaluation he was fatigued had lost his sense of taste and was concerned about Covid but testing was negative.    During that evaluation I recommended that he increase his minimum EPAP pressure from 6 cm up to 9 cm of water.  Over the last several months, he feels that he is sleeping better.  A download was obtained in the office today from September 12 through May 28, 2020 which confirms excellent compliance with usage on all days.  AHI, however remains elevated at 12.4 with an apnea index of 11.5.  He also is having central events with a central index of 9.1.  He does notice significant improvement in his new mask.  He continues to be on lisinopril 30 mg for hypertension.  He has rarely needed to take omeprazole for GERD.  He continues to work at [employer].  He presents for evaluation.  Past Medical History:  Diagnosis Date  . Arthritis   . Asthma    nebulizer tx used as needed. cortisone inj. for recent  respiratory issues.  . Cyst of skin and subcutaneous tissue 08-02-13   posterior neck area  . GERD (gastroesophageal reflux disease)   . Hypertension   . Neuromuscular disorder (HCC)    sciatic nerve pain in butttock-streoid inj. releived- 2 weeks ago  . Severe obstructive sleep apnea 04/16/2014    Past  Surgical History:  Procedure Laterality Date  . HERNIA REPAIR    . INSERTION OF MESH N/A 08/05/2013   Procedure: INSERTION OF MESH;  Surgeon: Velora Hecklerodd M Gerkin, MD;  Location: [facility];  Service: General;  Laterality: N/A;  . NASAL SEPTUM SURGERY    . TONSILLECTOMY    . UMBILICAL HERNIA REPAIR N/A 08/05/2013   Procedure: HERNIA REPAIR UMBILICAL ADULT;  Surgeon: Velora Hecklerodd M Gerkin, MD;  Location: [facility];  Service: General;  Laterality: N/A;  . WRIST FRACTURE SURGERY      Allergies  Allergen Reactions  . Codeine   . Hydrocodone   . Vicodin [Hydrocodone-Acetaminophen] Palpitations    SOB    Current Outpatient Medications  Medication Sig Dispense Refill  . albuterol (ACCUNEB) 1.25 MG/3ML nebulizer solution albuterol sulfate 1.25 mg/3 mL solution for nebulization    . ALPRAZolam (XANAX) 0.5 MG tablet Take by mouth.    . Calcium-Magnesium-Vitamin D (CALCIUM MAGNESIUM PO) Take 1 capsule by mouth daily.    . cholecalciferol (VITAMIN D) 1000 UNITS tablet Take 1,000 Units by mouth daily.    . Flaxseed, Linseed, (FLAXSEED OIL PO) Take 1 capsule by mouth daily.    Marland Kitchen. GARLIC PO Take 1 tablet by mouth daily.    . Melatonin 3 MG TABS Take 6-9 mg by mouth at bedtime.    . montelukast (SINGULAIR) 10 MG tablet Take 10 mg by mouth daily with breakfast.     . niacin 250 MG tablet Take 250 mg by mouth at bedtime.    . Omega-3 Fatty Acids (FISH OIL PO) Take 1 capsule by mouth daily.    Marland Kitchen. omeprazole (PRILOSEC) 20 MG capsule omeprazole 20 mg capsule,delayed release    . Red Yeast Rice Extract (RED YEAST RICE PO) Take 2 tablets by mouth at bedtime.    . selenium 50 MCG TABS tablet Take 50 mcg by mouth daily.     . sodium chloride (OCEAN) 0.65 % nasal spray Place 1 spray into the nose daily as needed for congestion.    . sodium fluoride (PREVIDENT) 1.1 % GEL dental gel PreviDent 5000 Booster Plus 1.1 % dental paste    . tamsulosin (FLOMAX) 0.4 MG CAPS capsule Take 0.4 mg by mouth daily.    . vitamin C (ASCORBIC ACID) 500 MG tablet Take 500 mg by mouth daily.    . vitamin E (VITAMIN E) 1000 UNIT capsule Take 1,000 Units by mouth 2 (two) times daily.     Marland Kitchen. lisinopril (ZESTRIL) 20 MG tablet Take 1.5 tablets (30 mg total) by mouth daily. 135 tablet 3   No current facility-administered medications for this visit.    Social History   Socioeconomic History  . Marital status: Single    Spouse name: Not on file  . Number of children: Not on file  . Years of education: Not on file  . Highest education level: Not on file  Occupational History  . Not on file  Tobacco Use  . Smoking status: Never Smoker  . Smokeless tobacco: Former Engineer, [employer]  Substance and Sexual Activity  . Alcohol use: Yes    Comment: 12 pack/ year  . Drug use: No  . Sexual activity: Not Currently  Other Topics Concern  . Not on file  Social History Narrative  . Not on file   Social Determinants of Health   Financial Resource Strain:   . Difficulty of Paying Living Expenses: Not on file  Food Insecurity:   . Worried About Programme researcher, broadcasting/film/videounning Out of Food in  the Last Year: Not on file  . Ran Out of Food in the Last Year: Not on file  Transportation Needs:   . Lack of Transportation (Medical): Not on file  . Lack of Transportation (Non-Medical): Not on file  Physical Activity:   . Days of Exercise per Week: Not on file  . Minutes of Exercise per Session: Not on file  Stress:   . Feeling of Stress : Not on file  Social Connections:   . Frequency of Communication with Friends and Family: Not on file  . Frequency of Social Gatherings with Friends and Family: Not on file  . Attends Religious Services: Not on file  . Active Member of Clubs  or Organizations: Not on file  . Attends Banker Meetings: Not on file  . Marital Status: Not on file  Intimate Partner Violence:   . Fear of Current or Ex-Partner: Not on file  . Emotionally Abused: Not on file  . Physically Abused: Not on file  . Sexually Abused: Not on file   Socially, he is single and never married.  He does not have children.  He lives by himself.  There is no tobacco history.  He drinks beer very rarely.  He recently started a job at [employer] requiring very early morning work.   ROS General: Negative; No fevers, chills, or night sweats; positive for obesity HEENT: Negative; No changes in vision or hearing, sinus congestion, difficulty swallowing Pulmonary: Negative; No cough, wheezing, shortness of breath, hemoptysis Cardiovascular: Negative; No chest pain, presyncope, syncope, palpatations GI: Negative; No nausea, vomiting, diarrhea, or abdominal pain GU: Negative; No dysuria, hematuria, or difficulty voiding Musculoskeletal: Negative; no myalgias, joint pain, or weakness Hematologic: Negative; no easy bruising, bleeding Endocrine: Negative; no heat/cold intolerance Neuro: Negative; no changes in balance, headaches Skin: Negative; No rashes or skin lesions Psychiatric: Negative; No behavioral problems, depression Sleep: See history of present illness;   Physical Exam BP 140/83   Pulse 61   Temp (!) 97 F (36.1 C)   Ht 5\' 11"  (1.803 m)   Wt 245 lb 9.6 oz (111.4 kg)   SpO2 98%   BMI 34.25 kg/m    Repeat blood pressure by me was 128/80  Wt Readings from Last 3 Encounters:  05/29/20 245 lb 9.6 oz (111.4 kg)  01/20/20 243 lb (110.2 kg)  09/05/19 249 lb 6.4 oz (113.1 kg)   General: Alert, oriented, no distress.  Skin: normal turgor, no rashes, warm and dry HEENT: Normocephalic, atraumatic. Pupils equal round and reactive to light; sclera anicteric; extraocular muscles intact;  Nose without nasal septal hypertrophy Mouth/Parynx benign;  Mallinpatti scale 3 Neck: No JVD, no carotid bruits; normal carotid upstroke Lungs: clear to ausculatation and percussion; no wheezing or rales Chest wall: without tenderness to palpitation Heart: PMI not displaced, RRR, s1 s2 normal, 1/6 systolic murmur, no diastolic murmur, no rubs, gallops, thrills, or heaves Abdomen: Central adiposity; soft, nontender; no hepatosplenomehaly, BS+; abdominal aorta nontender and not dilated by palpation. Back: no CVA tenderness Pulses 2+ Musculoskeletal: full range of motion, normal strength, no joint deformities Extremities: no clubbing cyanosis or edema, Homan's sign negative  Neurologic: grossly nonfocal; Cranial nerves grossly wnl Psychologic: Normal mood and affect   ECG (independently read by me): NSR at 61, no ST changes, normal intervals  August 28, 2019 ECG (independently read by me): Normal sinus rhythm at 79 bpm.  Normal intervals.  No ectopy   LABS:  BMP Latest Ref Rng & Units 09/09/2019  10/04/2015 08/02/2013  Glucose 65 - 99 mg/dL 720(N) 470(J) 95  BUN 8 - 27 mg/dL 22 13 12   Creatinine 0.76 - 1.27 mg/dL 6.28 3.66  BUN/Creat Ratio 10 - 24 24 - -  Sodium 134 - 144 mmol/L 142 140 139  Potassium 3.5 - 5.2 mmol/L 4.5 3.9 4.0  Chloride 96 - 106 mmol/L 105 106 102  CO2 20 - 29 mmol/L 22 23 27   Calcium 8.6 - 10.2 mg/dL 9.4 9.2 9.5   CBC Latest Ref Rng & Units 09/09/2019 10/04/2015 08/02/2013  WBC 3.4 - 10.8 x10E3/uL 9.7 6.3 6.9  Hemoglobin 13.0 - 17.7 g/dL 10/06/2015 08/04/2013 76.5  Hematocrit 37.5 - 51.0 % 43.1 47.4 43.3  Platelets 150 - 450 x10E3/uL 219 201 254   Lipid Panel  No results found for: CHOL, TRIG, HDL, CHOLHDL, VLDL, LDLCALC, LDLDIRECT, LABVLDL   RADIOLOGY: No results found.  IMPRESSION: 1. Essential hypertension   2. OSA (obstructive sleep apnea)   3. Moderate obesity   4. Medication management     ASSESSMENT AND PLAN: Mr. Ranson Belluomini is a 69 year old gentleman who has a longstanding history of hypertension for almost  30 years and had been on lisinopril 20 mg daily.  This was further titrated to 30 mg on my prior evaluation for improved blood pressure control.  He is followed by Dr. Nona Dell at urgent care.  In 2015 he was found to have severe obstructive sleep apnea with an AHI of 60/h associated with significant oxygen desaturation to a nadir of 74% during REM sleep.  He initiated CPAP therapy in July 2015 and has been exceptionally compliant over the past 5-1/2 years.  He states he would rarely ever miss a night and perhaps only missed 2 nights of not using CPAP.  He recently received a new ResMed air sense 10 CPAP machine and [employer] is his DME company.  When I recently saw him I recommended a change in his mask to a ResMed AirFit N 30i style which she has noticed marked benefit and improvement.  Previously he had significant issues with the nasal pillow mask causing nasal irritation requiring Vaseline.  He continues to be compliant with CPAP therapy.  He has residual AHI of 12.4 on his most recent download.  I am recommending slight titration we will change his pressures from 9-22 11-18.  He also was noted to have central events.  If these continue, he may require BiPAP titration.  He is euvolemic on exam.  Blood pressure today is stable on his increased lisinopril at 30 mg daily.  BMI is 34.25 consistent with obesity.  We discussed the importance of weight loss and exercise.  He feels better now that he is not eating his supper late and tries to eat dinner by 5 PM.  He will continue with current therapy.  I will see him in 6 months for follow-up evaluation or sooner as needed.  August 2015, MD, [facility]  05/31/2020 6:12 PM

## 2020-05-29 NOTE — Patient Instructions (Signed)
Medication Instructions:  Your physician recommends that you continue on your current medications as directed. Please refer to the Current Medication list given to you today.  *If you need a refill on your cardiac medications before your next appointment, please call your pharmacy*  Lab Work: FASTING LP/CMET/CBC/TSH FEW DAYS PRIOR TO FOLLOW UP   If you have labs (blood work) drawn today and your tests are completely normal, you will receive your results only by: Marland Kitchen MyChart Message (if you have MyChart) OR . A paper copy in the mail If you have any lab test that is abnormal or we need to change your treatment, we will call you to review the results.  Testing/Procedures: NONE  Follow-Up: At [facility], you and your health needs are our priority.  As part of our continuing mission to provide you with exceptional heart care, we have created designated Provider Care Teams.  These Care Teams include your primary Cardiologist (physician) and Advanced Practice Providers (APPs -  Physician Assistants and Nurse Practitioners) who all work together to provide you with the care you need, when you need it.  We recommend signing up for the patient portal called "MyChart".  Sign up information is provided on this After Visit Summary.  MyChart is used to connect with patients for Virtual Visits (Telemedicine).  Patients are able to view lab/test results, encounter notes, upcoming appointments, etc.  Non-urgent messages can be sent to your provider as well.   To learn more about what you can do with MyChart, go to [email or web address].    Your next appointment:   6 month(s)  The format for your next appointment:   In Person  Provider:   You may see DR Tresa Endo  or one of the following Advanced Practice Providers on your designated Care Team:    Corine Shelter, PA-C  [place], [place]  Edd Fabian, [place]

## 2020-05-31 ENCOUNTER — Encounter: Payer: Self-pay | Admitting: Cardiovascular Disease

## 2020-11-29 LAB — CBC WITH DIFFERENTIAL/PLATELET
Basophils Absolute: 0.1 10*3/uL (ref 0.0–0.2)
Basos: 1 %
EOS (ABSOLUTE): 0.8 10*3/uL — ABNORMAL HIGH (ref 0.0–0.4)
Eos: 15 %
Hematocrit: 43.5 % (ref 37.5–51.0)
Hemoglobin: 15.1 g/dL (ref 13.0–17.7)
Immature Grans (Abs): 0 10*3/uL (ref 0.0–0.1)
Immature Granulocytes: 0 %
Lymphocytes Absolute: 1.4 10*3/uL (ref 0.7–3.1)
Lymphs: 25 %
MCH: 32.3 pg (ref 26.6–33.0)
MCHC: 34.7 g/dL (ref 31.5–35.7)
MCV: 93 fL (ref 79–97)
Monocytes Absolute: 0.5 10*3/uL (ref 0.1–0.9)
Monocytes: 10 %
Neutrophils Absolute: 2.7 10*3/uL (ref 1.4–7.0)
Neutrophils: 49 %
Platelets: 234 10*3/uL (ref 150–450)
RBC: 4.68 x10E6/uL (ref 4.14–5.80)
RDW: 12.9 % (ref 11.6–15.4)
WBC: 5.5 10*3/uL (ref 3.4–10.8)

## 2020-11-29 LAB — COMPREHENSIVE METABOLIC PANEL
ALT: 26 IU/L (ref 0–44)
AST: 19 IU/L (ref 0–40)
Albumin/Globulin Ratio: 2.2 (ref 1.2–2.2)
Albumin: 4.4 g/dL (ref 3.8–4.8)
Alkaline Phosphatase: 60 IU/L (ref 44–121)
BUN/Creatinine Ratio: 18 (ref 10–24)
BUN: 16 mg/dL (ref 8–27)
Bilirubin Total: 0.6 mg/dL (ref 0.0–1.2)
CO2: 23 mmol/L (ref 20–29)
Calcium: 9.1 mg/dL (ref 8.6–10.2)
Chloride: 103 mmol/L (ref 96–106)
Creatinine, Ser: 0.89 mg/dL (ref 0.76–1.27)
Globulin, Total: 2 g/dL (ref 1.5–4.5)
Glucose: 98 mg/dL (ref 65–99)
Potassium: 4.1 mmol/L (ref 3.5–5.2)
Sodium: 141 mmol/L (ref 134–144)
Total Protein: 6.4 g/dL (ref 6.0–8.5)
eGFR: 94 mL/min/{1.73_m2} (ref >59)

## 2020-11-29 LAB — LIPID PANEL
Chol/HDL Ratio: 3.4 ratio (ref 0.0–5.0)
Cholesterol, Total: 165 mg/dL (ref 100–199)
HDL: 48 mg/dL (ref >39)
LDL Chol Calc (NIH): 103 mg/dL — ABNORMAL HIGH (ref 0–99)
Triglycerides: 70 mg/dL (ref 0–149)
VLDL Cholesterol Cal: 14 mg/dL (ref 5–40)

## 2020-11-29 LAB — TSH: TSH: 2.25 u[IU]/mL (ref 0.450–4.500)

## 2020-12-03 ENCOUNTER — Other Ambulatory Visit: Payer: Self-pay

## 2020-12-03 ENCOUNTER — Ambulatory Visit: Payer: Medicare HMO | Admitting: Cardiovascular Disease

## 2020-12-03 ENCOUNTER — Encounter: Payer: Self-pay | Admitting: Cardiovascular Disease

## 2020-12-03 VITALS — BP 132/74 | HR 63 | Ht 71.0 in | Wt 251.8 lb

## 2020-12-03 DIAGNOSIS — I10 Essential (primary) hypertension: Secondary | ICD-10-CM

## 2020-12-03 DIAGNOSIS — G4733 Obstructive sleep apnea (adult) (pediatric): Secondary | ICD-10-CM | POA: Diagnosis not present

## 2020-12-03 DIAGNOSIS — E668 Other obesity: Secondary | ICD-10-CM

## 2020-12-03 DIAGNOSIS — R972 Elevated prostate specific antigen [PSA]: Secondary | ICD-10-CM | POA: Diagnosis not present

## 2020-12-03 NOTE — Patient Instructions (Signed)

## 2020-12-03 NOTE — Progress Notes (Signed)
Patient ID: Jamie Schwartz, male   DOB: 07-Mar-1951, 70 y.o.   MRN: [id number]     HPI: Jamie Schwartz is a 70 y.o. male who is a patient of Dr. Pollyann KennedyMassenburg.  I saw him for initial sleep evaluation in August 2015.  After not having seen him in over 6 years, he was reevaluated on May 29, 2020.  He presents now for follow-up evaluation.    Jamie Schwartz has a history of hypertension, which initiated at age 70.  Remotely he had seen Dr. Irven ShellingSam Joiner.  He also has a history of obesity, as well as vitamin D insufficiency.  In 2015, he was referred by Dr. Pollyann KennedyMassenburg for a diagnostic polysomnogram due to complaints of loud snoring, witnessed apnea, gasping for breath, and nonrestorative sleep.  Typically, he goes to bed at 10:30 PM, falls asleep within 30 minutes, and then awakens at 6 AM.  There is no tobacco history.  He rarely drinks alcohol.   He underwent a split night study on 12/30/2013, which revealed severe obstructive sleep apnea.  On the baseline portion of the study his AHI was 61.6 per hour and REM sleep.  This was 60 per hour.  He had significant oxygen desaturation to 79% with non-REM sleep and down to 74% with REM sleep.  There was evidence for loud snoring.  He had a total of 2 central apneas, 81 mixed apneas, 64, obstructive apneas, and 49 hypotony is doing his study.  Because of his severe sleep apnea.  CPAP was initiated at 5 cm and was increased to 13 cm water pressure.  At this pressure AHI was excellent.  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  He was started on CPAP therapy in July 2015 and has a ResMed AirSense 10 CPAP unit set at a fixed pressure of 13 cm.  A download was obtained from July 26 through 04/10/2014 showed 100% compliance with 97% of days.  Use greater than 4 hours.  He is averaging 6 hours and 15 minutes per night.  His AHI was 9.0.  Initially, he was using a fullface mask, but more recently, he has had significant additional success using an  AirFit10 P nasal pillow mask.  He states he eats out a lot, and it's difficult for him to control his sodium with reference to food, eating out, as well as weight loss.  Since initiating CPAP therapy, he does subjectively feel improved.  He does work long hours.  He rides a bicycle for exercise and walks his basset hound dog  He is unaware of breakthrough snoring.   When seen during my evaluation in 2015 is Epworth Sleepiness Scale score endorsed at 12, which is still consistent with hypersomnolence.   Epworth Sleepiness Scale: Situation   Chance of Dozing/Sleeping (0 = never , 1 = slight chance , 2 = moderate chance , 3 = high chance )   sitting and reading 3   watching TV 1   sitting inactive in a public place 2   being a passenger in a motor vehicle for an hour or more 2   lying down in the afternoon 3   sitting and talking to someone 0   sitting quietly after lunch (no alcohol) 1   while stopped for a few minutes in traffic as the driver 0   Total Score  12    Since initiating CPAP therapy, Jamie Schwartz states that he is essentially 100% compliant.  At most  he may miss 1 or 2 nights per year of CPAP use.  He typically sleeps at least 7 hours per night.  Most recently, he has been working at [employer] and he therefore goes to bed at 7 PM and wakes up at 3 AM.  He is at work at [employer] AM and works until [employer] AM.  He had felt significantly improved on CPAP therapy.  However, over the past month his CPAP unit has malfunction.  He potentially was in need to try to obtain parts.  He had contacted [employer] to his now his DME company since their takeover of [facility].  However since he had not been evaluated in several years a repeat sleep evaluation was recommended prior to potentially obtaining parts or a new machine.  Over the past month, he is not sleeping well.  He is very tired during the day.  His sleep is nonrestorative.  His mouth is very dry.  He finds that he is almost falling asleep while  driving and his symptoms have significantly worsened the longer he is not been on his CPAP therapy.  A new Epworth Sleepiness Scale score was calculated in the office today and this endorsed at 8.    When I saw him I felt that he would qualify for a new ResMed air sense 10 CPAP auto unit.  He was having issues with his nasal pillow mask causing nasal irritation and I suggested trying the ResMed AirFit N 30i style.  I saw him in a telemedicine evaluation in June 2021.  He received a new ResMed air sense 10 CPAP machine and was now using [employer] as his DME company.  A download was obtained from Dec 21, 2019 through January 19, 2020 which shows that he is meeting compliance with 90 days of usage.  Average usage is 5 hours and 30 minutes which meets compliance but I discussed with him that this is inadequate sleep duration.  He typically goes to bed between 10:30 and 11 at night.  He had been walking and working at [employer] but recently quit that job due to issues with his feet and plantar fasciitis.  His blood pressure has been elevated.  Several weeks prior to that evaluation he was fatigued had lost his sense of taste and was concerned about Covid but testing was negative.    During that evaluation I recommended that he increase his minimum EPAP pressure from 6 cm up to 9 cm of water.  I last saw him May 29, 2020 over the previous several months he felt improved and was sleeping better. A download was obtained in the office today from September 12 through May 28, 2020 which confirms excellent compliance with usage on all days.  AHI, however remains elevated at 12.4 with an apnea index of 11.5.  He also hadcentral events with a central index of 9.1.  He does notice significant improvement in his new mask.  He continues to be on lisinopril 30 mg for hypertension.  He has rarely needed to take omeprazole for GERD.  He continues to work at [employer].    Since I saw him, he had COVID omicron infection in January 2022.   Recently he has started to feel better and is beginning to exercise.  A download of his CPAP machine was obtained from March 8 through November 21, 2020.  Compliance is excellent with 100% use and average use is now 7 hours and 42 minutes.  His auto CPAP is set at  a minimum pressure of 11 with a maximum pressure of 18.  AHI is 10.3 with 8.3 central events.  His 95th percentile pressure is 12.8 with a maximum average pressure of 13.6.  An Epworth Sleepiness Scale score was calculated in the office today and this endorsed at 8.  He is also being followed by urology with an elevated PSA at 8.8.  He presents for evaluation.  Past Medical History:  Diagnosis Date  . Arthritis   . Asthma    nebulizer tx used as needed. cortisone inj. for recent respiratory issues.  . Cyst of skin and subcutaneous tissue 08-02-13   posterior neck area  . GERD (gastroesophageal reflux disease)   . Hypertension   . Neuromuscular disorder (HCC)    sciatic nerve pain in butttock-streoid inj. releived- 2 weeks ago  . Severe obstructive sleep apnea 04/16/2014    Past Surgical History:  Procedure Laterality Date  . HERNIA REPAIR    . INSERTION OF MESH N/A 08/05/2013   Procedure: INSERTION OF MESH;  Surgeon: Velora Heckler, MD;  Location: [facility];  Service: General;  Laterality: N/A;  . NASAL SEPTUM SURGERY    . TONSILLECTOMY    . UMBILICAL HERNIA REPAIR N/A 08/05/2013   Procedure: HERNIA REPAIR UMBILICAL ADULT;  Surgeon: Velora Heckler, MD;  Location: [facility];  Service: General;  Laterality: N/A;  . WRIST FRACTURE SURGERY      Allergies  Allergen Reactions  . Codeine   . Hydrocodone   . Vicodin [Hydrocodone-Acetaminophen] Palpitations    SOB    Current Outpatient Medications  Medication Sig Dispense Refill  . albuterol (ACCUNEB) 1.25 MG/3ML nebulizer solution albuterol sulfate 1.25 mg/3 mL solution for nebulization    . Calcium-Magnesium-Vitamin D (CALCIUM MAGNESIUM PO) Take 1 capsule by mouth daily.    .  cholecalciferol (VITAMIN D) 1000 UNITS tablet Take 1,000 Units by mouth daily.    Marland Kitchen GARLIC PO Take 1 tablet by mouth daily.    Marland Kitchen lisinopril (ZESTRIL) 20 MG tablet Take 1.5 tablets (30 mg total) by mouth daily. 135 tablet 3  . Melatonin 3 MG TABS Take 6-9 mg by mouth at bedtime.    . montelukast (SINGULAIR) 10 MG tablet Take 10 mg by mouth daily with breakfast.     . Omega-3 Fatty Acids (FISH OIL PO) Take 1 capsule by mouth daily.    Marland Kitchen selenium 50 MCG TABS tablet Take 50 mcg by mouth daily.    . sodium chloride (OCEAN) 0.65 % nasal spray Place 1 spray into the nose daily as needed for congestion.    . sodium fluoride (FLUORISHIELD) 1.1 % GEL dental gel PreviDent 5000 Booster Plus 1.1 % dental paste    . tamsulosin (FLOMAX) 0.4 MG CAPS capsule Take 0.4 mg by mouth daily.    . vitamin C (ASCORBIC ACID) 500 MG tablet Take 500 mg by mouth daily.    . vitamin E 1000 UNIT capsule Take 1,000 Units by mouth 2 (two) times daily.      No current facility-administered medications for this visit.    Social History   Socioeconomic History  . Marital status: Single    Spouse name: Not on file  . Number of children: Not on file  . Years of education: Not on file  . Highest education level: Not on file  Occupational History  . Not on file  Tobacco Use  . Smoking status: Never Smoker  . Smokeless tobacco: Former Engineer, water and Sexual Activity  .  Alcohol use: Yes    Comment: 12 pack/ year  . Drug use: No  . Sexual activity: Not Currently  Other Topics Concern  . Not on file  Social History Narrative  . Not on file   Social Determinants of Health   Financial Resource Strain: Not on file  Food Insecurity: Not on file  Transportation Needs: Not on file  Physical Activity: Not on file  Stress: Not on file  Social Connections: Not on file  Intimate Partner Violence: Not on file   Socially, he is single and never married.  He does not have children.  He lives by himself.  There is no  tobacco history.  He drinks beer very rarely.  He recently started a job at [employer] requiring very early morning work.   ROS General: Negative; No fevers, chills, or night sweats; positive for obesity HEENT: Negative; No changes in vision or hearing, sinus congestion, difficulty swallowing Pulmonary: Negative; No cough, wheezing, shortness of breath, hemoptysis Cardiovascular: Negative; No chest pain, presyncope, syncope, palpatations GI: Negative; No nausea, vomiting, diarrhea, or abdominal pain GU: Negative; No dysuria, hematuria, or difficulty voiding Musculoskeletal: Negative; no myalgias, joint pain, or weakness Hematologic: Negative; no easy bruising, bleeding Endocrine: Negative; no heat/cold intolerance Neuro: Negative; no changes in balance, headaches Skin: Negative; No rashes or skin lesions Psychiatric: Negative; No behavioral problems, depression Sleep: See history of present illness;   Physical Exam BP 132/74 (BP Location: Left Arm, Patient Position: Sitting, Cuff Size: Large)   Pulse 63   Ht 5\' 11"  (1.803 m)   Wt 251 lb 12.8 oz (114.2 kg)   SpO2 97%   BMI 35.12 kg/m    Repeat blood pressure by me 116/70  Wt Readings from Last 3 Encounters:  12/03/20 251 lb 12.8 oz (114.2 kg)  05/29/20 245 lb 9.6 oz (111.4 kg)  01/20/20 243 lb (110.2 kg)   General: Alert, oriented, no distress.  Skin: normal turgor, no rashes, warm and dry HEENT: Normocephalic, atraumatic. Pupils equal round and reactive to light; sclera anicteric; extraocular muscles intact;  Nose without nasal septal hypertrophy Mouth/Parynx benign; Mallinpatti scale 3 Neck: No JVD, no carotid bruits; normal carotid upstroke Lungs: clear to ausculatation and percussion; no wheezing or rales Chest wall: without tenderness to palpitation Heart: PMI not displaced, RRR, s1 s2 normal, 1/6 systolic murmur, no diastolic murmur, no rubs, gallops, thrills, or heaves Abdomen: soft, nontender; no hepatosplenomehaly, BS+;  abdominal aorta nontender and not dilated by palpation. Back: no CVA tenderness Pulses 2+ Musculoskeletal: full range of motion, normal strength, no joint deformities Extremities: no clubbing cyanosis or edema, Homan's sign negative  Neurologic: grossly nonfocal; Cranial nerves grossly wnl Psychologic: Normal mood and affect   ECG (independently read by me): NSR at 63; no ectopy  October 2021 ECG (independently read by me): NSR at 61, no ST changes, normal intervals  August 28, 2019 ECG (independently read by me): Normal sinus rhythm at 79 bpm.  Normal intervals.  No ectopy   LABS:  BMP Latest Ref Rng & Units 11/29/2020 09/09/2019 10/04/2015  Glucose 65 - 99 mg/dL 98 10/06/2015) 829(F)  BUN 8 - 27 mg/dL 16 22 13   Creatinine 0.76 - 1.27 mg/dL 621(H 0.86  BUN/Creat Ratio 10 - 24 18 24  -  Sodium 134 - 144 mmol/L 141 142 140  Potassium 3.5 - 5.2 mmol/L 4.1 4.5 3.9  Chloride 96 - 106 mmol/L 103 105 106  CO2 20 - 29 mmol/L 23 22 23   Calcium 8.6 -  10.2 mg/dL 9.1 9.4 9.2   CBC Latest Ref Rng & Units 11/29/2020 09/09/2019 10/04/2015  WBC 3.4 - 10.8 x10E3/uL 5.5 9.7 6.3  Hemoglobin 13.0 - 17.7 g/dL 16.1 09.6 04.5  Hematocrit 37.5 - 51.0 % 43.5 43.1 47.4  Platelets 150 - 450 x10E3/uL 234 219 201   Lipid Panel     Component Value Date/Time   CHOL 165 11/29/2020 0802   TRIG 70 11/29/2020 0802   HDL 48 11/29/2020 0802   CHOLHDL 3.4 11/29/2020 0802   LDLCALC 103 (H) 11/29/2020 0802   LABVLDL 14 11/29/2020 0802     RADIOLOGY: No results found.  IMPRESSION: 1. Essential hypertension   2. OSA (obstructive sleep apnea)   3. Moderate obesity   4. Elevated PSA    ASSESSMENT AND PLAN: Jamie Schwartz is a 70 year-old gentleman who has a longstanding history of hypertension for almost 30 years and had been on lisinopril 20 mg daily.  This was further titrated to 30 mg on my prior evaluation for improved blood pressure control.  He is followed by Dr. Pollyann Kennedy at urgent care.  Pressure  today is stable and on repeat by me was 116/70 on lisinopril 30 mg daily.  He has a history of severe obstructive sleep apnea with initial AHI at 60/h in 2015.  He received a new ResMed air sense 10 CPAP machine in 2021.  He had issues with mask leak and nasal irritation from prior nasal pillows.  His most recent download demonstrates excellent compliance.  However he is still having both obstructive and central events.  If this becomes problematic, he may require transition to BiPAP or possible ASV.  Presently he is not having any daytime sleepiness with a calculated Epworth scale score of 8 today in the office.  BMI is consistent with moderate obesity.  I discussed the importance of weight loss and increased exercise.  He was recently found to have an elevated PSA and is undergoing urologic evaluation and was started on tamsulosin.  He will follow-up with his primary care provider.  I will see him in 1 year for follow-up evaluation or sooner as needed.   Lennette Bihari, MD, [facility]  12/05/2020 5:32 PM

## 2020-12-05 ENCOUNTER — Encounter: Payer: Self-pay | Admitting: Cardiovascular Disease

## 2020-12-06 ENCOUNTER — Other Ambulatory Visit: Payer: Self-pay

## 2020-12-06 MED ORDER — EZETIMIBE 10 MG PO TABS: 10.0000 mg | ORAL_TABLET | Freq: Every day | ORAL | 3 refills | Status: DC

## 2020-12-24 ENCOUNTER — Other Ambulatory Visit: Payer: Self-pay | Admitting: Cardiovascular Disease

## 2021-02-26 ENCOUNTER — Encounter: Payer: Self-pay | Admitting: *Deleted

## 2021-02-27 ENCOUNTER — Telehealth: Payer: Self-pay | Admitting: Neurology

## 2021-02-27 ENCOUNTER — Ambulatory Visit: Payer: Medicare HMO | Admitting: Neurology

## 2021-02-27 ENCOUNTER — Encounter: Payer: Self-pay | Admitting: Neurology

## 2021-02-27 VITALS — BP 115/71 | HR 71 | Ht 71.0 in | Wt 243.0 lb

## 2021-02-27 DIAGNOSIS — H8190 Unspecified disorder of vestibular function, unspecified ear: Secondary | ICD-10-CM

## 2021-02-27 DIAGNOSIS — H6122 Impacted cerumen, left ear: Secondary | ICD-10-CM | POA: Diagnosis not present

## 2021-02-27 DIAGNOSIS — H81399 Other peripheral vertigo, unspecified ear: Secondary | ICD-10-CM

## 2021-02-27 NOTE — Telephone Encounter (Signed)
02/27/21 [employer] medicare sent to [facility] they obtain auth [facility]

## 2021-02-27 NOTE — Progress Notes (Signed)
Subjective:    Patient ID: Jamie Schwartz is a 70 y.o. male.  HPI    Jamie Foley, MD, PhD [facility] [address] [address] [place], [place] [address]  Dear Jamie Schwartz,   I saw your patient, Jamie Schwartz, upon your kind request in my neurologic clinic today for initial consultation of his vertigo, concern for positional vertigo.  The patient is unaccompanied today.  As you know, Jamie Schwartz is a 70 year old right-handed gentleman with an underlying medical history of reflux disease, vitamin D deficiency, asthma, arthritis, hypertension, obstructive sleep apnea (followed by Dr. Tresa Schwartz in cardiology), chest pain, and obesity, who reports recurrent bouts of vertigo for the past 5 years.  First time it happened it was after his hernia surgery.  Upon review of his chart, hernia surgery was actually in 2014.  He can go months without a bout of vertigo.  His symptoms may last a few days.  He has been taking meclizine off and on for the past 5 years he reports.  He has not recently seen an ENT, saw an ENT when he was about 70 years old for deviated septum.  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 Shon Schwartz.  He was supposed to see a Engineer, petroleum as I understand.  He has not had any physical therapy for vertigo.  He is currently without symptoms.  He denies any recurrent headaches.  He recently saw Dr. Nicki Schwartz in cardiology for follow-up of his sleep apnea.  He reports compliance with his positive airway pressure device but he was noted to have residual obstructive and central episodes.  He had blood work in April 2022 after seeing Dr. Tresa Schwartz.  I reviewed the test results in his chart.  He denies any hearing loss or ringing in the ears but upon examination his hearing was a little better on the right compared to left. He reports chronic neck pain and has seen a chiropractor for this.  I reviewed your office note  from 11/15/2020, at which time he reported a 4-day history of episodic vertiginous symptoms with spinning sensation especially when turning in bed. He had blood work through your office on 09/13/2020 and I was able to review the results: Vitamin D level was 77, PSA 8.85, elevated.  Hepatitis C antibody nonreactive, CBC with differential showed WBC of 5.7, hemoglobin 13.9, hematocrit 41.1, CMP showed glucose of 86, BUN 15, creatinine 0.81, total protein 6.0, AST 24, ALT 28, lipid panel showed a total cholesterol of 132, HDL 36, triglycerides 85, LDL 79.  He is a non-smoker and drinks alcohol rarely, limited caffeine, about a cup of coffee per day on average.  He tries to hydrate well with water.  He has not had any recent falls.  His Past Medical History Is Significant For: Past Medical History:  Diagnosis Date   Arthritis    Asthma    nebulizer tx used as needed. cortisone inj. for recent respiratory issues.   Cyst of skin and subcutaneous tissue 08-02-13   posterior neck area   GERD (gastroesophageal reflux disease)    Hypertension    Neuromuscular disorder (HCC)    sciatic nerve pain in butttock-streoid inj. releived- 2 weeks ago   Severe obstructive sleep apnea 04/16/2014    His Past Surgical History Is Significant For: Past Surgical History:  Procedure Laterality Date   HERNIA REPAIR     INSERTION OF MESH N/A 08/05/2013  Procedure: INSERTION OF MESH;  Surgeon: Jamie Heckler, MD;  Location: [facility];  Service: General;  Laterality: N/A;   NASAL SEPTUM SURGERY     TONSILLECTOMY     UMBILICAL HERNIA REPAIR N/A 08/05/2013   Procedure: HERNIA REPAIR UMBILICAL ADULT;  Surgeon: Jamie Heckler, MD;  Location: [facility];  Service: General;  Laterality: N/A;   WRIST FRACTURE SURGERY      His Family History Is Significant For: Family History  Problem Relation Age of Onset   Brain cancer Mother        tumor   Alzheimer's disease Father     His Social History Is Significant For: Social  History   Socioeconomic History   Marital status: Single    Spouse name: Not on file   Number of children: Not on file   Years of education: Not on file   Highest education level: Bachelor's degree (e.g., BA, AB, BS)  Occupational History   Occupation: Education administrator  Tobacco Use   Smoking status: Never   Smokeless tobacco: Former  Substance and Sexual Activity   Alcohol use: Yes    Comment: 12 pack/ year   Drug use: No   Sexual activity: Not Currently  Other Topics Concern   Not on file  Social History Narrative   Lives alone   Right handed   Caffeine: 6 cups a week   Social Determinants of Health   Financial Resource Strain: Not on file  Food Insecurity: Not on file  Transportation Needs: Not on file  Physical Activity: Not on file  Stress: Not on file  Social Connections: Not on file    His Allergies Are:  Allergies  Allergen Reactions   Codeine    Hydrocodone    Theophylline    Vicodin [Hydrocodone-Acetaminophen] Palpitations    SOB  :   His Current Medications Are:  Outpatient Encounter Medications as of 02/27/2021  Medication Sig   albuterol (ACCUNEB) 1.25 MG/3ML nebulizer solution albuterol sulfate 1.25 mg/3 mL solution for nebulization   Calcium-Magnesium-Vitamin D (CALCIUM MAGNESIUM PO) Take 1 capsule by mouth daily.   cholecalciferol (VITAMIN D) 1000 UNITS tablet Take 1,000 Units by mouth daily.   GARLIC PO Take 1 tablet by mouth daily.   lisinopril (ZESTRIL) 20 MG tablet TAKE 1 AND 1/2 TABLETS DAILY BY MOUTH   Melatonin 3 MG TABS Take 6-9 mg by mouth at bedtime.   meloxicam (MOBIC) 15 MG tablet    montelukast (SINGULAIR) 10 MG tablet Take 10 mg by mouth daily with breakfast.    Omega-3 Fatty Acids (FISH OIL PO) Take 1 capsule by mouth daily.   omeprazole (PRILOSEC) 20 MG capsule    selenium 50 MCG TABS tablet Take 50 mcg by mouth daily.   sodium chloride (OCEAN) 0.65 % nasal spray Place 1 spray into the nose daily as needed for congestion.   sodium  fluoride (FLUORISHIELD) 1.1 % GEL dental gel PreviDent 5000 Booster Plus 1.1 % dental paste   tamsulosin (FLOMAX) 0.4 MG CAPS capsule Take 0.4 mg by mouth daily.   vitamin C (ASCORBIC ACID) 500 MG tablet Take 500 mg by mouth daily.   vitamin E 1000 UNIT capsule Take 1,000 Units by mouth 2 (two) times daily.    [DISCONTINUED] ezetimibe (ZETIA) 10 MG tablet Take 1 tablet (10 mg total) by mouth daily.   No facility-administered encounter medications on file as of 02/27/2021.  :   Review of Systems:  Out of a complete 14 point review of systems,  all are reviewed and negative with the exception of these symptoms as listed below:  Review of Systems  Neurological:        Pt presents today with a lipoma on the back of his neck, not painful. Pt states when turning his neck it feels like it catches. Pt had been seeing a chiropractor for 1 year and helped with it not "catching". Pt has not gone back in 3 months.    Objective:  Neurological Exam  Physical Exam Physical Examination:   Vitals:   02/27/21 0813  BP: 115/71  Pulse: 71   General Examination: The patient is a very pleasant 70 y.o. male in no acute distress. He appears well-developed and well-nourished and well groomed.  No vertiginous symptoms currently, no orthostatic symptoms.  HEENT: Normocephalic, atraumatic, pupils are equal, round and reactive to light and accommodation.  Extraocular tracking is well-preserved, normal smooth pursuit, no nystagmus, no vertiginous symptoms upon changes in head position.  Tympanic membrane is clear on the right with some cerumen in the ear canal on the right, cerumen obscuring the tympanic membrane on the left.  Hearing is better on the right with tuning fork.  Face is symmetric with normal facial animation and normal sensation to light touch, vibration and temperature.  Speech is without dysarthria, without hypophonia or tremor.  Airway examination reveals mild mouth dryness and tongue protrudes  centrally as well as palate elevates symmetrically.  Neck is supple with full range of motion, no carotid bruits.  Soft tissue swelling posterior neck in the midline area.  Likely lipoma.  Chest: Clear to auscultation without wheezing, rhonchi or crackles noted.  Heart: S1+S2+0, regular and normal without murmurs, rubs or gallops noted.   Abdomen: Soft, non-tender and non-distended with normal bowel sounds appreciated on auscultation.  Extremities: There is no pitting edema in the distal lower extremities bilaterally. Pedal pulses are intact.  Skin: Warm and dry without trophic changes noted. There are fairly prominent varicose veins in both distal legs.  Musculoskeletal: exam reveals no obvious joint deformities, tenderness or joint swelling or erythema.   Neurologically:  Mental status: The patient is awake, alert and oriented in all 4 spheres. His immediate and remote memory, attention, language skills and fund of knowledge are appropriate. There is no evidence of aphasia, agnosia, apraxia or anomia. Speech is clear with normal prosody and enunciation. Thought process is linear. Mood is normal and affect is normal.  Cranial nerves II - XII are as described above under HEENT exam. In addition: shoulder shrug is normal with equal shoulder height noted. Motor exam: Normal bulk, strength and tone is noted. There is no drift, tremor or rebound. Romberg is negative. Reflexes are 2+ throughout. Babinski: Toes are flexor bilaterally. Fine motor skills and coordination: intact with normal finger taps, normal hand movements, normal rapid alternating patting, normal foot taps and normal foot agility.  Cerebellar testing: No dysmetria or intention tremor on finger to nose testing. Heel to shin is unremarkable bilaterally. There is no truncal or gait ataxia.  Sensory exam: intact to light touch, vibration, temperature sense in the upper and lower extremities.  Gait, station and balance: He stands easily.  No veering to one side is noted. No leaning to one side is noted. Posture is age-appropriate and stance is narrow based. Gait shows normal stride length and normal pace. No problems turning are noted. Tandem walk is unremarkable.   Assessment and Plan:   In summary, Nona DellRonald Schwartz is a very pleasant 70 y.o.-year  old male with an underlying medical history of reflux disease, vitamin D deficiency, asthma, arthritis, hypertension, obstructive sleep apnea (followed by Dr. Tresa Schwartz in cardiology), chest pain, and obesity, who presents for evaluation of his recurrent vertigo.  He reports that his first episode was after his hernia surgery, this was in 2014.  He reports symptoms for about 5 years and he has used meclizine as needed.  He has not seen ENT for this but I would recommend he get a consultation with ENT.  He does have cerumen impaction on the left side which could affect his vertigo as well.  He is encouraged to talk to you about a referral to ENT.  Physical therapy can help when he is in an active bout of vertigo.  He is encouraged to talk to you about a physical therapy evaluation and treatment in case he has a recurrent bouts of vertigo, vestibular rehab can help.  He is advised to stay well-hydrated and well rested and continue with his positive airway pressure device consistently.  For chronic neck pain he is advised to reconsult with Dr. Shon Schwartz.  He has fairly prominent varicose veins and may benefit from seeing a pain specialist.  He is advised to talk to you about a referral.  From my end of things, his neurological exam is benign which is reassuring.  Nevertheless, to rule out a structural cause of his vertigo I would like to proceed with a brain MRI with and without contrast.  He is agreeable.  He is advised that we will call him with his MRI results and so long as the results are benign, reassuring, and age-appropriate, we can see him in follow-up in this clinic on an as-needed basis.  I answered all  his questions today and he was in agreement.   Thank you very much for allowing me to participate in the care of this nice patient. If I can be of any further assistance to you please do not hesitate to call me at [phone].  Sincerely,   Jamie Foley, MD, PhD

## 2021-02-27 NOTE — Patient Instructions (Signed)
It was nice to meet you today.  Your neurological exam is benign.  You do have excess buildup of wax in the left ear canal.  For your recurrent vertigo symptoms I recommend that you consult with an ENT (ear, nose, and throat) specialist, please talk to your primary care about a referral to ENT.  Please remember, that vertigo can recur without warning, triggers could be stress, sleep deprivation, dehydration and even taking a bumpy car ride, sometimes an acute illness, even a common (viral) cold. It can last hours or days. Please change positions slowly and always stay well-hydrated. Physical therapy with particular attention to vestibular rehabilitation can be very helpful. While there is no specific medication that helps with vertigo, some people get relief with as needed use of meclizine. Certain medications can exacerbate vertigo.   If you have a recurrent bout of vertigo, you can talk to your primary care about a referral to physical therapy at the time.  For your neck pain, I recommend you make a follow-up appointment with Dr. Shon Baton.  For your lipoma, I recommend that you talk to your primary care about a referral to a plastic surgeon.  As discussed, we will proceed with a brain scan, called MRI and call you with the test results. We will have to schedule you for this on a separate date. This test requires authorization from your insurance, and we will take care of the insurance process.  So long as your brain MRI shows age-appropriate and reassuring findings, we can see you in this clinic on an as-needed basis.

## 2021-03-12 ENCOUNTER — Other Ambulatory Visit: Payer: Medicare HMO

## 2021-03-12 ENCOUNTER — Ambulatory Visit
Admission: RE | Admit: 2021-03-12 | Discharge: 2021-03-12 | Disposition: A | Discharge status: Home / Self Care | Payer: Medicare HMO | Source: Ambulatory Visit | Attending: Neurology | Admitting: Neurology

## 2021-03-12 DIAGNOSIS — H81399 Other peripheral vertigo, unspecified ear: Secondary | ICD-10-CM

## 2021-03-12 MED ORDER — GADOBENATE DIMEGLUMINE 529 MG/ML IV SOLN
20.0000 mL | Freq: Once | INTRAVENOUS | Status: AC | PRN
  Administered 2021-03-12: 20 mL via INTRAVENOUS

## 2021-03-19 ENCOUNTER — Telehealth: Payer: Self-pay

## 2021-03-19 NOTE — Telephone Encounter (Signed)
I called the pt and advised of results. He verbalized understanding and will follow up with his PCP office.

## 2021-03-19 NOTE — Telephone Encounter (Signed)
-----   Message from Jamie Foley, MD sent at 03/14/2021  3:28 PM EDT ----- Please call patient and advise him that his brain MRI with and without contrast from 03/12/2021 was reported as normal.  As discussed, patient can follow-up with his primary care physician or PA at this point.

## 2021-12-14 ENCOUNTER — Other Ambulatory Visit: Payer: Self-pay

## 2021-12-14 ENCOUNTER — Emergency Department (HOSPITAL_COMMUNITY)
Admission: EM | Admit: 2021-12-14 | Discharge: 2021-12-14 | Disposition: A | Discharge status: Home / Self Care | Payer: Medicare HMO | Attending: Emergency Medicine | Admitting: Emergency Medicine

## 2021-12-14 ENCOUNTER — Emergency Department (HOSPITAL_COMMUNITY): Payer: Medicare HMO

## 2021-12-14 ENCOUNTER — Encounter (HOSPITAL_COMMUNITY): Payer: Self-pay

## 2021-12-14 DIAGNOSIS — I10 Essential (primary) hypertension: Secondary | ICD-10-CM | POA: Diagnosis not present

## 2021-12-14 DIAGNOSIS — Z79899 Other long term (current) drug therapy: Secondary | ICD-10-CM | POA: Insufficient documentation

## 2021-12-14 DIAGNOSIS — K5900 Constipation, unspecified: Secondary | ICD-10-CM | POA: Insufficient documentation

## 2021-12-14 DIAGNOSIS — R197 Diarrhea, unspecified: Secondary | ICD-10-CM | POA: Diagnosis present

## 2021-12-14 DIAGNOSIS — J45909 Unspecified asthma, uncomplicated: Secondary | ICD-10-CM | POA: Diagnosis not present

## 2021-12-14 LAB — URINALYSIS, ROUTINE W REFLEX MICROSCOPIC
Bilirubin Urine: NEGATIVE
Glucose, UA: NEGATIVE mg/dL
Hgb urine dipstick: NEGATIVE
Ketones, ur: NEGATIVE mg/dL
Leukocytes,Ua: NEGATIVE
Nitrite: NEGATIVE
Protein, ur: NEGATIVE mg/dL
Specific Gravity, Urine: 1.008 (ref 1.005–1.030)
pH: 6 (ref 5.0–8.0)

## 2021-12-14 LAB — COMPREHENSIVE METABOLIC PANEL
ALT: 25 U/L (ref 0–44)
AST: 18 U/L (ref 15–41)
Albumin: 3.5 g/dL (ref 3.5–5.0)
Alkaline Phosphatase: 53 U/L (ref 38–126)
Anion gap: 8 (ref 5–15)
BUN: 17 mg/dL (ref 8–23)
CO2: 26 mmol/L (ref 22–32)
Calcium: 8.6 mg/dL — ABNORMAL LOW (ref 8.9–10.3)
Chloride: 102 mmol/L (ref 98–111)
Creatinine, Ser: 0.92 mg/dL (ref 0.61–1.24)
GFR, Estimated: >60 mL/min (ref >60)
Glucose, Bld: 103 mg/dL — ABNORMAL HIGH (ref 70–99)
Potassium: 3.8 mmol/L (ref 3.5–5.1)
Sodium: 136 mmol/L (ref 135–145)
Total Bilirubin: 0.8 mg/dL (ref 0.3–1.2)
Total Protein: 6.2 g/dL — ABNORMAL LOW (ref 6.5–8.1)

## 2021-12-14 LAB — CBC WITH DIFFERENTIAL/PLATELET
Abs Immature Granulocytes: 0.02 10*3/uL (ref 0.00–0.07)
Basophils Absolute: 0 10*3/uL (ref 0.0–0.1)
Basophils Relative: 1 %
Eosinophils Absolute: 0.4 10*3/uL (ref 0.0–0.5)
Eosinophils Relative: 6 %
HCT: 41.7 % (ref 39.0–52.0)
Hemoglobin: 14.5 g/dL (ref 13.0–17.0)
Immature Granulocytes: 0 %
Lymphocytes Relative: 18 %
Lymphs Abs: 1.1 10*3/uL (ref 0.7–4.0)
MCH: 31.6 pg (ref 26.0–34.0)
MCHC: 34.8 g/dL (ref 30.0–36.0)
MCV: 90.8 fL (ref 80.0–100.0)
Monocytes Absolute: 0.9 10*3/uL (ref 0.1–1.0)
Monocytes Relative: 14 %
Neutro Abs: 3.8 10*3/uL (ref 1.7–7.7)
Neutrophils Relative %: 61 %
Platelets: 215 10*3/uL (ref 150–400)
RBC: 4.59 MIL/uL (ref 4.22–5.81)
RDW: 13.2 % (ref 11.5–15.5)
WBC: 6.2 10*3/uL (ref 4.0–10.5)
nRBC: 0 % (ref 0.0–0.2)

## 2021-12-14 LAB — LIPASE, BLOOD: Lipase: 29 U/L (ref 11–51)

## 2021-12-14 MED ORDER — IOHEXOL 300 MG/ML  SOLN
100.0000 mL | Freq: Once | INTRAMUSCULAR | Status: AC | PRN
  Administered 2021-12-14: 100 mL via INTRAVENOUS

## 2021-12-14 MED ORDER — POLYETHYLENE GLYCOL 3350 17 G PO PACK: 17.0000 g | PACK | Freq: Every day | ORAL | 0 refills | Status: DC

## 2021-12-14 NOTE — ED Triage Notes (Signed)
Patient here with complaints of "blockage in colon." States that he has had diarrhea all week. States that he had colonic enema yesterday and was told that he had blockage. ?

## 2021-12-14 NOTE — ED Provider Notes (Signed)
?[facility] ?Provider Note ? ? ?CSN: [id number] ?Arrival date & time: 12/14/21  0847 ? ?  ? ?History ? ?Chief Complaint  ?Patient presents with  ? Abdominal Pain  ? ? ?Jamie Schwartz is a 71 y.o. male. ? ? ?Abdominal Pain ? ?Patient with medical history notable for hypertension, GERD and asthma presents today due to diarrhea.   ? ?Patient states he has not had a full bowel movement in over a week.  He is having up to 8 episodes of diarrhea daily without any mucus or blood.  No recent travel or antibiotic usage.  Reports 1 week ago he saw a professional at the [facility] integr[facility] medicine in [place], [place] for colon hydrotherapy, states they were unable to do the enema due to "a blockage".  They advised him to drink warm water and not to eat any solid foods, he has not anything to eat or drink in the last 4 days.    States his abdomen feels bloated and he feels weak.  He denies any abdominal pain, he is also not having any nausea or vomiting.  No rectal bleeding. ? ?Surgical history notable for umbilical hernia repair. ? ?Home Medications ?Prior to Admission medications   ?Medication Sig Start Date End Date Taking? Authorizing Provider  ?Calcium-Magnesium-Vitamin D (CALCIUM MAGNESIUM PO) Take 1 capsule by mouth daily.   Yes [provider]  ?cholecalciferol (VITAMIN D) 1000 UNITS tablet Take 1,000 Units by mouth daily.   Yes [provider]  ?GARLIC PO Take 1 tablet by mouth daily.   Yes [provider]  ?lisinopril (ZESTRIL) 20 MG tablet TAKE 1 AND 1/2 TABLETS DAILY BY MOUTH ?Patient taking differently: Take 10-20 mg by mouth in the morning and at bedtime. 1 tablet in the morning and 1/2 tablet in the evening. 12/24/20  Yes Lennette Bihari, MD  ?Melatonin 3 MG TABS Take 6-9 mg by mouth at bedtime.   Yes [provider]  ?montelukast (SINGULAIR) 10 MG tablet Take 10 mg by mouth daily with breakfast.    Yes [provider]  ?Omega-3 Fatty Acids  (FISH OIL PO) Take 1 capsule by mouth daily.   Yes [provider]  ?polyethylene glycol (MIRALAX / GLYCOLAX) 17 g packet Take 17 g by mouth daily. 12/14/21  Yes Theron Arista, PA-C  ?selenium 50 MCG TABS tablet Take 50 mcg by mouth daily.   Yes [provider]  ?sodium fluoride (FLUORISHIELD) 1.1 % GEL dental gel Place 1 application. onto teeth at bedtime.   Yes [provider]  ?tamsulosin (FLOMAX) 0.4 MG CAPS capsule Take 0.4 mg by mouth daily.   Yes [provider]  ?vitamin C (ASCORBIC ACID) 500 MG tablet Take 500 mg by mouth daily.   Yes [provider]  ?vitamin E 1000 UNIT capsule Take 1,000 Units by mouth 2 (two) times daily.    Yes [provider]  ?   ? ?Allergies    ?Codeine, Hydrocodone, Theophylline, and Vicodin [hydrocodone-acetaminophen]   ? ?Review of Systems   ?Review of Systems  ?Gastrointestinal:  Positive for abdominal pain.  ? ?Physical Exam ?Updated Vital Signs ?BP 123/73   Pulse 79   Temp 97.6 ?F (36.4 ?C) (Oral)   Resp 18   Ht 5\' 11"  (1.803 m)   Wt 105.2 kg   SpO2 100%   BMI 32.36 kg/m?  ?Physical Exam ?Vitals and nursing note reviewed. Exam conducted with a chaperone present.  ?Constitutional:   ?  Appearance: Normal appearance.  ?HENT:  ?   Head: Normocephalic and atraumatic.  ?Eyes:  ?   General: No scleral icterus.    ?   Right eye: No discharge.     ?   Left eye: No discharge.  ?   Extraocular Movements: Extraocular movements intact.  ?   Pupils: Pupils are equal, round, and reactive to light.  ?Cardiovascular:  ?   Rate and Rhythm: Normal rate and regular rhythm.  ?   Pulses: Normal pulses.  ?   Heart sounds: Normal heart sounds. No murmur heard. ?  No friction rub. No gallop.  ?Pulmonary:  ?   Effort: Pulmonary effort is normal. No respiratory distress.  ?   Breath sounds: Normal breath sounds.  ?Abdominal:  ?   General: Abdomen is flat. A surgical scar is present. Bowel sounds are normal. There is no distension.  ?    Palpations: Abdomen is soft.  ?   Tenderness: There is no abdominal tenderness.  ?   Comments: Abdomen is soft, no focal tenderness, rigidity or guarding.  ?Genitourinary: ?   Comments: Rectum visualized, no obstructing mass noted. ?Skin: ?   General: Skin is warm and dry.  ?   Coloration: Skin is not jaundiced.  ?Neurological:  ?   Mental Status: He is alert. Mental status is at baseline.  ?   Coordination: Coordination normal.  ? ? ?ED Results / Procedures / Treatments   ?Labs ?(all labs ordered are listed, but only abnormal results are displayed) ?Labs Reviewed  ?COMPREHENSIVE METABOLIC PANEL - Abnormal; Notable for the following components:  ?    Result Value  ? Glucose, Bld 103 (*)   ? Calcium 8.6 (*)   ? Total Protein 6.2 (*)   ? All other components within normal limits  ?GASTROINTESTINAL PANEL BY PCR, STOOL (REPLACES STOOL CULTURE)  ?CBC WITH DIFFERENTIAL/PLATELET  ?LIPASE, BLOOD  ?URINALYSIS, ROUTINE W REFLEX MICROSCOPIC  ? ? ?EKG ?None ? ?Radiology ?CT Abdomen Pelvis W Contrast ? ?Result Date: 12/14/2021 ?CLINICAL DATA:  Bowel obstruction suspected. Rectal discomfort with defecation. EXAM: CT ABDOMEN AND PELVIS WITH CONTRAST TECHNIQUE: Multidetector CT imaging of the abdomen and pelvis was performed using the standard protocol following bolus administration of intravenous contrast. RADIATION DOSE REDUCTION: This exam was performed according to the departmental dose-optimization program which includes automated exposure control, adjustment of the mA and/or kV according to patient size and/or use of iterative reconstruction technique. CONTRAST:  OMNIPAQUE IOHEXOL 300 MG/ML  SOLN COMPARISON:  None. FINDINGS: Lower chest: Scar versus subsegmental atelectasis identified within the lingula. Hepatobiliary: 1.7 x 1.0 cm low-density structure is identified within segment 6, image 32/2. Indeterminate. Gallbladder appears normal. No bile duct dilatation. Pancreas: Unremarkable. No pancreatic ductal dilatation or  surrounding inflammatory changes. Spleen: Normal in size without focal abnormality. Adrenals/Urinary Tract: Normal adrenal glands. Bilateral Bosniak class 1 cysts are identified. No follow-up recommended. Small stone within upper pole of the right kidney measures 3 mm. No hydronephrosis. Urinary bladder is unremarkable. Stomach/Bowel: Stomach appears normal. The appendix is visualized and appears normal. No signs of bowel wall thickening, inflammation, or distension. No significant stool burden identified within the colon. There is increased soft tissue thickening at the level of the lower rectum, image 83/2 Vascular/Lymphatic: The abdominal vasculature appears patent. Mild aortic atherosclerosis. No abdominopelvic adenopathy identified. Reproductive: Prostate gland is enlarged. Other: No free fluid or fluid collections identified. No signs of pneumoperitoneum. Status post umbilical hernia repair. Musculoskeletal: Spondylosis identified within the lumbar  scratch set mild lumbar spondylosis. No acute or suspicious osseous findings. IMPRESSION: 1. No acute findings identified within the abdomen or pelvis. No significant stool burden identified within the colon. 2. There is increased soft tissue thickening at the level of the lower rectum. This is nonspecific and may be related to incomplete distention. In a patient presenting with rectal bleeding recommend correlation with direct visualization to exclude underlying neoplasm. 3. Indeterminate low-density structure within segment 6 of the liver. Recommend further evaluation with nonemergent contrast enhanced MRI. 4. Nonobstructing right renal calculus. 5. Enlarged prostate gland. 6. Aortic Atherosclerosis (ICD10-I70.0). Electronically Signed   By: Signa Kellaylor  Stroud M.D.   On: 12/14/2021 10:56   ? ?Procedures ?Procedures  ? ? ?Medications Ordered in ED ?Medications  ?iohexol (OMNIPAQUE) 300 MG/ML solution 100 mL (100 mLs Intravenous Contrast Given 12/14/21 1008)  ? ? ?ED  Course/ Medical Decision Making/ A&P ?  ?                        ?Medical Decision Making ?Amount and/or Complexity of Data Reviewed ?Labs: ordered. ?Radiology: ordered. ? ?Risk ?Prescription drug management. ? ? ?This

## 2021-12-14 NOTE — ED Notes (Signed)
Back from CT, alert, NAD, calm, interactive.  

## 2021-12-14 NOTE — ED Notes (Addendum)
Pt alert, NAD, calm, interactive, resps e/u, speaking in clear complete sentences. Abd soft NT. Denies abd pain, NVD, fever, bleeding, or hemorrhoids. Admits to rectal discomfort with defecation. Endorses "haven't eaten much for several days, feel generally weak". Attempted colonic irrigation/enema yesterday. ?

## 2021-12-14 NOTE — Discharge Instructions (Signed)
1) increase the amount of fiber you are consuming.  You can do this with supplements or by eating more fiber heavy foods. ?2 ) start taking MiraLAX daily.  Start with half a spoonful in the morning, you can increase as needed until you are regularly having bowel movements daily. ?3) Call your GI doctor to schedule an appointment for later this week for reevaluation. ?

## 2021-12-14 NOTE — ED Notes (Signed)
Clean catch urine sent to lab.

## 2021-12-16 ENCOUNTER — Other Ambulatory Visit: Payer: Self-pay | Admitting: Cardiovascular Disease

## 2022-01-02 ENCOUNTER — Other Ambulatory Visit: Payer: Self-pay | Admitting: Gastroenterology

## 2022-01-02 DIAGNOSIS — R9389 Abnormal findings on diagnostic imaging of other specified body structures: Secondary | ICD-10-CM

## 2022-01-02 DIAGNOSIS — K5900 Constipation, unspecified: Secondary | ICD-10-CM

## 2022-01-22 ENCOUNTER — Ambulatory Visit
Admission: RE | Admit: 2022-01-22 | Discharge: 2022-01-22 | Disposition: A | Discharge status: Home / Self Care | Payer: Medicare HMO | Source: Ambulatory Visit | Attending: Gastroenterology | Admitting: Gastroenterology

## 2022-01-22 DIAGNOSIS — R9389 Abnormal findings on diagnostic imaging of other specified body structures: Secondary | ICD-10-CM

## 2022-01-22 DIAGNOSIS — K5900 Constipation, unspecified: Secondary | ICD-10-CM

## 2022-01-22 MED ORDER — GADOBENATE DIMEGLUMINE 529 MG/ML IV SOLN
20.0000 mL | Freq: Once | INTRAVENOUS | Status: AC | PRN
  Administered 2022-01-22: 20 mL via INTRAVENOUS

## 2022-05-28 ENCOUNTER — Encounter: Payer: Self-pay | Admitting: Cardiovascular Disease

## 2022-05-28 ENCOUNTER — Ambulatory Visit: Payer: Medicare HMO | Attending: Cardiovascular Disease | Admitting: Cardiovascular Disease

## 2022-05-28 VITALS — BP 128/74 | HR 66 | Ht 71.0 in | Wt 230.0 lb

## 2022-05-28 DIAGNOSIS — I10 Essential (primary) hypertension: Secondary | ICD-10-CM | POA: Diagnosis not present

## 2022-05-28 DIAGNOSIS — E6609 Other obesity due to excess calories: Secondary | ICD-10-CM | POA: Diagnosis not present

## 2022-05-28 DIAGNOSIS — G4733 Obstructive sleep apnea (adult) (pediatric): Secondary | ICD-10-CM | POA: Diagnosis not present

## 2022-05-28 DIAGNOSIS — R972 Elevated prostate specific antigen [PSA]: Secondary | ICD-10-CM | POA: Diagnosis not present

## 2022-05-28 DIAGNOSIS — Z6832 Body mass index (BMI) 32.0-32.9, adult: Secondary | ICD-10-CM

## 2022-05-28 NOTE — Patient Instructions (Signed)
Medication Instructions:  Continue same medications *If you need a refill on your cardiac medications before your next appointment, please call your pharmacy*   Lab Work: None ordered   Testing/Procedures: None ordered   Follow-Up: At [facility], you and your health needs are our priority.  As part of our continuing mission to provide you with exceptional heart care, we have created designated Provider Care Teams.  These Care Teams include your primary Cardiologist (physician) and Advanced Practice Providers (APPs -  Physician Assistants and Nurse Practitioners) who all work together to provide you with the care you need, when you need it.  We recommend signing up for the patient portal called "MyChart".  Sign up information is provided on this After Visit Summary.  MyChart is used to connect with patients for Virtual Visits (Telemedicine).  Patients are able to view lab/test results, encounter notes, upcoming appointments, etc.  Non-urgent messages can be sent to your provider as well.   To learn more about what you can do with MyChart, go to [email or web address].    Your next appointment:  1 year   Call in July to schedule Oct appointment     The format for your next appointment: Office   Provider:  Dr.Kelly   Important Information About Sugar       

## 2022-05-28 NOTE — Progress Notes (Signed)
Patient ID: Jamie Schwartz, male   DOB: 1951-03-21, 71 y.o.   MRN: [id number]        HPI: Jamie Schwartz is a 71 y.o. male who is a patient of Dr. Altha Harm.  I saw him for initial sleep evaluation in August 2015.  After not having seen him in over 6 years, he was reevaluated on May 29, 2020.  I last saw him in April 2022.  He presents for an 61-month follow-up evaluation.  Jamie Schwartz has a history of hypertension, which initiated at age 58.  Remotely he had seen Dr. Dimitri Ped.  He also has a history of obesity, as well as vitamin D insufficiency.  In 2015, he was referred by Dr. Altha Harm for a diagnostic polysomnogram due to complaints of loud snoring, witnessed apnea, gasping for breath, and nonrestorative sleep.  Typically, he goes to bed at 10:30 PM, falls asleep within 30 minutes, and then awakens at 6 AM.  There is no tobacco history.  He rarely drinks alcohol.   He underwent a split night study on 12/30/2013, which revealed severe obstructive sleep apnea.  On the baseline portion of the study his AHI was 61.6 per hour and REM sleep.  This was 60 per hour.  He had significant oxygen desaturation to 79% with non-REM sleep and down to 74% with REM sleep.  There was evidence for loud snoring.  He had a total of 2 central apneas, 81 mixed apneas, 64, obstructive apneas, and 49 hypotony is doing his study.  Because of his severe sleep apnea.  CPAP was initiated at 5 cm and was increased to 13 cm water pressure.  At this pressure AHI was excellent.  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  He was started on CPAP therapy in July 2015 and has a ResMed AirSense 10 CPAP unit set at a fixed pressure of 13 cm.  A download was obtained from July 26 through 04/10/2014 showed 100% compliance with 97% of days.  Use greater than 4 hours.  He is averaging 6 hours and 15 minutes per night.  His AHI was 9.0.  Initially, he was using a fullface mask, but more recently, he has had  significant additional success using an AirFit10 P nasal pillow mask.  He states he eats out a lot, and it's difficult for him to control his sodium with reference to food, eating out, as well as weight loss.  Since initiating CPAP therapy, he does subjectively feel improved.  He does work long hours.  He rides a bicycle for exercise and walks his basset hound dog  He is unaware of breakthrough snoring.   When seen during my evaluation in 2015 is Epworth Sleepiness Scale score endorsed at 12, which is still consistent with hypersomnolence.   Epworth Sleepiness Scale: Situation   Chance of Dozing/Sleeping (0 = never , 1 = slight chance , 2 = moderate chance , 3 = high chance )   sitting and reading 3   watching TV 1   sitting inactive in a public place 2   being a passenger in a motor vehicle for an hour or more 2   lying down in the afternoon 3   sitting and talking to someone 0   sitting quietly after lunch (no alcohol) 1   while stopped for a few minutes in traffic as the driver 0   Total Score  12    Since initiating CPAP therapy, Jamie Schwartz  states that he is essentially 100% compliant.  At most he may miss 1 or 2 nights per year of CPAP use.  He typically sleeps at least 7 hours per night.  Most recently, he has been working at [employer] and he therefore goes to bed at 7 PM and wakes up at 3 AM.  He is at work at [employer] AM and works until [employer] AM.  He had felt significantly improved on CPAP therapy.  However, over the past month his CPAP unit has malfunction.  He potentially was in need to try to obtain parts.  He had contacted [employer] to his now his DME company since their takeover of [facility].  However since he had not been evaluated in several years a repeat sleep evaluation was recommended prior to potentially obtaining parts or a new machine.  Over the past month, he is not sleeping well.  He is very tired during the day.  His sleep is nonrestorative.  His mouth is very dry.  He finds  that he is almost falling asleep while driving and his symptoms have significantly worsened the longer he is not been on his CPAP therapy.  A new Epworth Sleepiness Scale score was calculated in the office today and this endorsed at 8.    When I saw him I felt that he would qualify for a new ResMed air sense 10 CPAP auto unit.  He was having issues with his nasal pillow mask causing nasal irritation and I suggested trying the ResMed AirFit N 30i style.   I saw him in a telemedicine evaluation in June 2021.  He received a new ResMed air sense 10 CPAP machine and was now using [employer] as his DME company.  A download was obtained from Dec 21, 2019 through January 19, 2020 which shows that he is meeting compliance with 90 days of usage.  Average usage is 5 hours and 30 minutes which meets compliance but I discussed with him that this is inadequate sleep duration.  He typically goes to bed between 10:30 and 11 at night.  He had been walking and working at [employer] but recently quit that job due to issues with his feet and plantar fasciitis.  His blood pressure has been elevated.  Several weeks prior to that evaluation he was fatigued had lost his sense of taste and was concerned about Covid but testing was negative.    During that evaluation I recommended that he increase his minimum EPAP pressure from 6 cm up to 9 cm of water.  I saw him May 29, 2020 over the previous several months he felt improved and was sleeping better. A download was obtained in the office today from September 12 through May 28, 2020 which confirms excellent compliance with usage on all days.  AHI, however remains elevated at 12.4 with an apnea index of 11.5.  He also hadcentral events with a central index of 9.1.  He does notice significant improvement in his new mask.  He continues to be on lisinopril 30 mg for hypertension.  He has rarely needed to take omeprazole for GERD.  He continues to work at [employer].    I last saw him in April 2022.   He had COVID omicron infection in January 2022.  Recently he has started to feel better and is beginning to exercise.  A download of his CPAP machine was obtained from March 8 through November 21, 2020.  Compliance is excellent with 100% use and average use  is now 7 hours and 42 minutes.  His auto CPAP is set at a minimum pressure of 11 with a maximum pressure of 18.  AHI is 10.3 with 8.3 central events.  His 95th percentile pressure is 12.8 with a maximum average pressure of 13.6.  An Epworth Sleepiness Scale score was calculated in the office today and this endorsed at 8.  He is also being followed by urology with an elevated PSA at 8.8.  During that evaluation, he felt he was sleeping well.  He was still having both obstructive and central events and I discussed the possibility of future need to switch to BiPAP or possible ASV.  However he felt well and his Epworth scale was 8.  We discussed the importance of weight loss for his obesity.  He was to follow-up with urology concerning his elevated PSA.  Since I last saw him, he states his PSA had improved and he has been followed by urology.  He has had weight loss with his weight at 251 when seen by me in April 2022 to current weight of 230.  He basically walks his dog.  He was bitten by brown recluse spider and required treatment earlier this year.  He continues to be on lisinopril 30 mg for hypertension.  He is on Singulair for asthma.  He admits to excellent compliance with CPAP.  Download was obtained from September 11 through May 27, 2022.  Usage is excellent  at 100% with duration at 8 hours and 12 minutes per night.  His dog is getting old and he typically has to take him out to go to the bathroom at 3 AM disrupting his sleep.  His pressure range is set at a minimum of 11 to maximum of 18.  AHI continues to be elevated at 11.2 with a central index of 8.7.  His 95th percentile pressure is 12.8 with a maximum average pressure 13.8.  He denies any significant  volume overload but admits to frequent urination nocturnally.  He denies chest pain PND orthopnea.  He presents for evaluation.  Past Medical History:  Diagnosis Date   Arthritis    Asthma    nebulizer tx used as needed. cortisone inj. for recent respiratory issues.   Cyst of skin and subcutaneous tissue 08-02-13   posterior neck area   GERD (gastroesophageal reflux disease)    Hypertension    Neuromuscular disorder (HCC)    sciatic nerve pain in [place]-streoid inj. releived- 2 weeks ago   Severe obstructive sleep apnea 04/16/2014    Past Surgical History:  Procedure Laterality Date   HERNIA REPAIR     INSERTION OF MESH N/A 08/05/2013   Procedure: INSERTION OF MESH;  Surgeon: Earnstine Regal, MD;  Location: [facility];  Service: General;  Laterality: N/A;   NASAL SEPTUM SURGERY     TONSILLECTOMY     UMBILICAL HERNIA REPAIR N/A 08/05/2013   Procedure: HERNIA REPAIR UMBILICAL ADULT;  Surgeon: Earnstine Regal, MD;  Location: [facility];  Service: General;  Laterality: N/A;   WRIST FRACTURE SURGERY      Allergies  Allergen Reactions   Codeine    Hydrocodone    Theophylline    Vicodin [Hydrocodone-Acetaminophen] Palpitations    SOB    Current Outpatient Medications  Medication Sig Dispense Refill   Calcium-Magnesium-Vitamin D (CALCIUM MAGNESIUM PO) Take 1 capsule by mouth daily.     cholecalciferol (VITAMIN D) 1000 UNITS tablet Take 1,000 Units by mouth daily.     GARLIC  PO Take 1 tablet by mouth daily.     lisinopril (ZESTRIL) 20 MG tablet TAKE 1 AND 1/2 TABLETS BY MOUTH DAILY 135 tablet 3   montelukast (SINGULAIR) 10 MG tablet Take 10 mg by mouth daily with breakfast.      Omega-3 Fatty Acids (FISH OIL PO) Take 1 capsule by mouth daily.     polyethylene glycol (MIRALAX / GLYCOLAX) 17 g packet Take 17 g by mouth daily. 14 each 0   tamsulosin (FLOMAX) 0.4 MG CAPS capsule Take 0.4 mg by mouth daily.     vitamin C (ASCORBIC ACID) 500 MG tablet Take 500 mg by mouth daily.     vitamin E  1000 UNIT capsule Take 1,000 Units by mouth 2 (two) times daily.      No current facility-administered medications for this visit.    Social History   Socioeconomic History   Marital status: Single    Spouse name: Not on file   Number of children: Not on file   Years of education: Not on file   Highest education level: Bachelor's degree (e.g., BA, AB, BS)  Occupational History   Occupation: Education administrator  Tobacco Use   Smoking status: Never   Smokeless tobacco: Former  Substance and Sexual Activity   Alcohol use: Yes    Comment: 12 pack/ year   Drug use: No   Sexual activity: Not Currently  Other Topics Concern   Not on file  Social History Narrative   Lives alone   Right handed   Caffeine: 6 cups a week   Social Determinants of Health   Financial Resource Strain: Not on file  Food Insecurity: Not on file  Transportation Needs: Not on file  Physical Activity: Not on file  Stress: Not on file  Social Connections: Not on file  Intimate Partner Violence: Not on file   Socially, he is single and never married.  He does not have children.  He lives by himself.  There is no tobacco history.  He drinks beer very rarely.  He recently started a job at [employer] requiring very early morning work.   ROS General: Negative; No fevers, chills, or night sweats; positive for obesity HEENT: Negative; No changes in vision or hearing, sinus congestion, difficulty swallowing Pulmonary: Negative; No cough, wheezing, shortness of breath, hemoptysis Cardiovascular: Negative; No chest pain, presyncope, syncope, palpatations GI: Negative; No nausea, vomiting, diarrhea, or abdominal pain GU: Followed by urology for PSA elevation which had improved. Musculoskeletal: Negative; no myalgias, joint pain, or weakness Hematologic: Negative; no easy bruising, bleeding Endocrine: Negative; no heat/cold intolerance Neuro: Negative; no changes in balance, headaches Skin: Negative; No rashes or skin  lesions Psychiatric: Negative; No behavioral problems, depression Sleep: See history of present illness;   Physical Exam BP 128/74   Pulse 66   Ht 5\' 11"  (1.803 m)   Wt 230 lb (104.3 kg)   SpO2 97%   BMI 32.08 kg/m   Repeat blood pressure by me was 122/68  Wt Readings from Last 3 Encounters:  05/28/22 230 lb (104.3 kg)  12/14/21 232 lb (105.2 kg)  02/27/21 243 lb (110.2 kg)   General: Alert, oriented, no distress.  Skin: normal turgor, no rashes, warm and dry HEENT: Normocephalic, atraumatic. Pupils equal round and reactive to light; sclera anicteric; extraocular muscles intact;  Nose without nasal septal hypertrophy Mouth/Parynx benign; Mallinpatti scale 3 Neck: No JVD, no carotid bruits; normal carotid upstroke Lungs: clear to ausculatation and percussion; no wheezing or rales Chest wall:  without tenderness to palpitation Heart: PMI not displaced, RRR, s1 s2 normal, 1/6 systolic murmur, no diastolic murmur, no rubs, gallops, thrills, or heaves Abdomen: Lower abdominal adiposity; soft, nontender; no hepatosplenomehaly, BS+; abdominal aorta nontender and not dilated by palpation. Back: no CVA tenderness Pulses 2+ Musculoskeletal: full range of motion, normal strength, no joint deformities Extremities: no clubbing cyanosis or edema, Homan's sign negative  Neurologic: grossly nonfocal; Cranial nerves grossly wnl Psychologic: Normal mood and affect  May 28, 2022 ECG (independently read by me): NSR at 66, no ectopy  December 03, 2020 ECG (independently read by me): NSR at 63; no ectopy  October 2021 ECG (independently read by me): NSR at 61, no ST changes, normal intervals  August 28, 2019 ECG (independently read by me): Normal sinus rhythm at 79 bpm.  Normal intervals.  No ectopy   LABS:     Latest Ref Rng & Units 12/14/2021    8:58 AM 11/29/2020    8:02 AM 09/09/2019   11:21 AM  BMP  Glucose 70 - 99 mg/dL 103  98  111   BUN 8 - 23 mg/dL 17  16  22    Creatinine 0.61  - 1.24 mg/dL 0.92  0.89  0.93   BUN/Creat Ratio 10 - 24  18  24    Sodium 135 - 145 mmol/L 136  141  142   Potassium 3.5 - 5.1 mmol/L 3.8  4.1  4.5   Chloride 98 - 111 mmol/L 102  103  105   CO2 22 - 32 mmol/L 26  23  22    Calcium 8.9 - 10.3 mg/dL 8.6  9.1  9.4       Latest Ref Rng & Units 12/14/2021    8:58 AM 11/29/2020    8:02 AM 09/09/2019   11:21 AM  CBC  WBC 4.0 - 10.5 K/uL 6.2  5.5  9.7   Hemoglobin 13.0 - 17.0 g/dL 14.5  15.1  15.4   Hematocrit 39.0 - 52.0 % 41.7  43.5  43.1   Platelets 150 - 400 K/uL 215  234  219    Lipid Panel     Component Value Date/Time   CHOL 165 11/29/2020 0802   TRIG 70 11/29/2020 0802   HDL 48 11/29/2020 0802   CHOLHDL 3.4 11/29/2020 0802   LDLCALC 103 (H) 11/29/2020 0802   LABVLDL 14 11/29/2020 0802     RADIOLOGY: No results found.  IMPRESSION: 1. OSA (obstructive sleep apnea)   2. Essential hypertension   3. Elevated PSA   4. Class 1 obesity due to excess calories with serious comorbidity and body mass index (BMI) of 32.0 to 32.9 in adult     ASSESSMENT AND PLAN: Jamie Schwartz is a 71 year-old gentleman who has a longstanding history of hypertension for almost 30 years and had been on lisinopril 20 mg daily.  This was further titrated to 30 mg on my prior evaluation for improved blood pressure control.  He is followed by Dr. Altha Harm at urgent care.  Blood pressure today is stable and a repeat by me was 122/68.  He continues to be on lisinopril 30 mg daily and is tolerating this well.  Since his last evaluation in April 2022 he has lost approximately 20 pounds with weight today down to 230.  BMI is still increased.  Discussed the importance of him creased exercise of at least 30 minutes a day 5 days a week for total weekly amount of 150 minutes if at all  possible.  He walks his dog typically does not routinely exercise beyond this.  He has been documented to have severe obstructive sleep apnea with his initial AHI at 60/h in 2015.  In  2021 he received a new CPAP machine.  Current download from September 11 through May 27, 2022 shows 100% usage days with usage per night averaging 8 hours and 12 minutes.  At his pressure of 11 to 18 cm of water, his 95th percentile pressure is 12.8 with maximum average pressure 13.8 but AHI is increased at 11.2 when he has a central index of 8.7.  Believes he is sleeping well.  He is rested in the morning.  His sleep is restorative.  He continues to have nocturia several times per night.  I have made slight adjustment to his CPAP unit today and will slightly decrease his pressure range down to 10-18 to see if this potentially can affect his centrals.  I have we will ask our sleep coordinator to obtain a download in 1 to 2 months for reassessment.  If he continues to have significant central events we may need to review consider BiPAP or possible ASV as alternative therapy.  Since his machine is fairly new and only 53-1/69 years old at present we will continue his current device.  An Epworth Sleepiness Scale score today was normal and endorsed at 3 argue against residual daytime sleepiness.  His asthma is well controlled on montelukast.  I will see him in 1 year or sooner as needed.  Troy Sine, MD, [facility]  05/28/2022 6:21 PM

## 2022-08-19 ENCOUNTER — Encounter: Payer: Self-pay | Admitting: Cardiovascular Disease

## 2022-12-13 ENCOUNTER — Other Ambulatory Visit: Payer: Self-pay | Admitting: Cardiovascular Disease

## 2023-02-18 ENCOUNTER — Telehealth: Payer: Self-pay | Admitting: Cardiovascular Disease

## 2023-02-18 NOTE — Telephone Encounter (Signed)
We don't refill this medication. Refused and wrote to send to PCP.

## 2023-02-18 NOTE — Telephone Encounter (Signed)
*  STAT* If patient is at the pharmacy, call can be transferred to refill team.   1. Which medications need to be refilled? (please list name of each medication and dose if known) montelukast (SINGULAIR) 10 MG tablet   2. Which pharmacy/location (including street and city if local pharmacy) is medication to be sent to? [id number] - [place], [place] - [address]   3. Do they need a 30 day or 90 day supply? 90

## 2023-09-05 IMAGING — MR MR ABDOMEN WO/W CM
17 series · 48 of 48 positions shown · IV contrast (EOVIST)
Comparison: CT abdomen and pelvis 12/14/2021

CLINICAL DATA: Liver lesion evaluation

EXAM:
MRI ABDOMEN WITHOUT AND WITH CONTRAST
TECHNIQUE: Multiplanar multisequence MR imaging of the abdomen was performed
both before and after the administration of intravenous contrast.
CONTRAST:  20mL MULTIHANCE GADOBENATE DIMEGLUMINE 529 MG/ML IV SOLN

[Series 3: T2 · axial · 5.0mm · 1.60mm/px · 1 of 47 slices shown (1 of 3)]
[im 1/47]
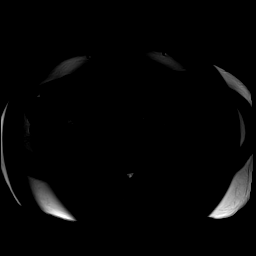

[Series 4: T2 · coronal · 5.0mm · 1.60mm/px · 2 of 48 slices shown (2 of 3)]
[im 1/48]
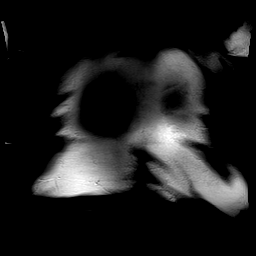
[im 48/48]
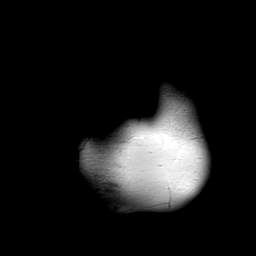

[Series 5: T1 · axial · 3.0mm · 1.28mm/px · z∈[-183,+78]mm · 6 of 176 slices shown]
[im 1/176]
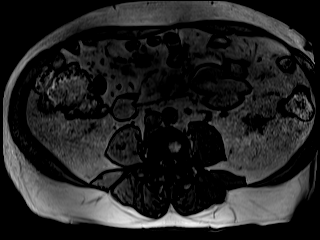
[im 36/176]
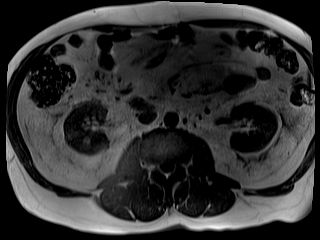
[im 71/176]
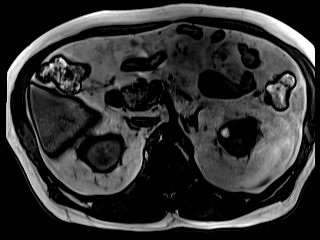
[im 106/176]
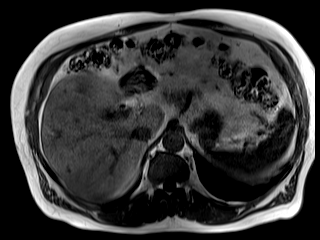
[im 141/176]
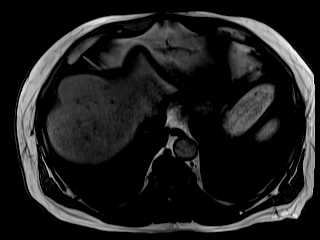
[im 176/176]
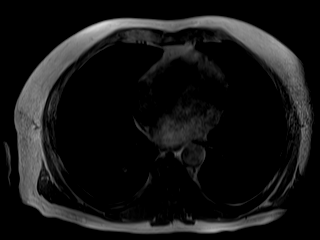

[Series 6: bSSFP · axial · 5.0mm · 1.28mm/px · 1 of 44 slices shown]
[im 1/44]
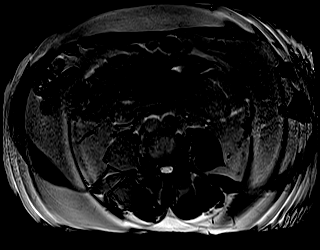

[Series 7: DWI · axial · 5.0mm · 1.53mm/px · z∈[-172,+122]mm · 5 of 150 slices shown (1 of 2)]
[im 1/150]
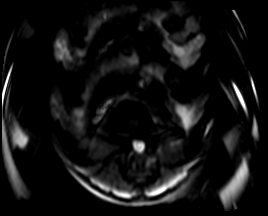
[im 38/150]
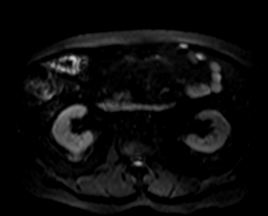
[im 75/150]
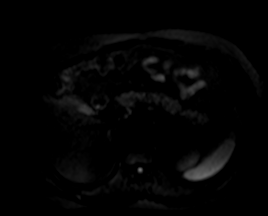
[im 112/150]
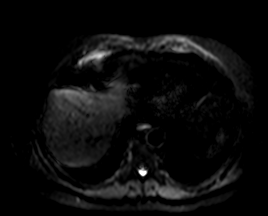
[im 150/150]
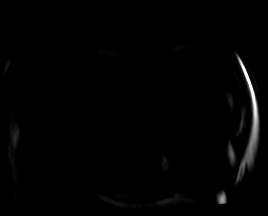

[Series 8: DWI · axial · 5.0mm · 1.53mm/px · z∈[-172,+122]mm · 2 of 50 slices shown (2 of 2)]
[im 1/50]
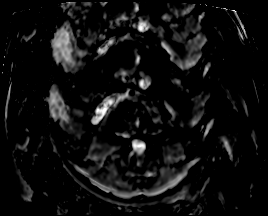
[im 50/50]
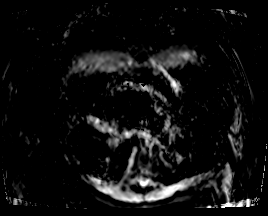

[Series 9: T2 · axial · 6.0mm · 1.60mm/px · 1 of 37 slices shown (3 of 3)]
[im 1/37]
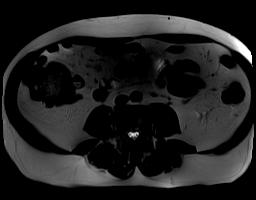

[Series 10: T1 dynamic · axial · non-contrast · 3.0mm · 1.25mm/px · z∈[-183,+78]mm · 3 of 88 slices shown]
[im 1/88]
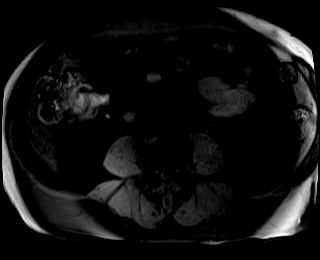
[im 44/88]
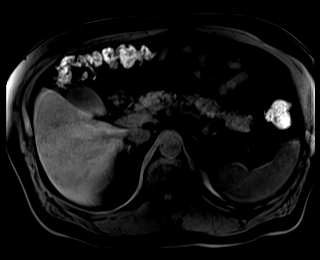
[im 88/88]
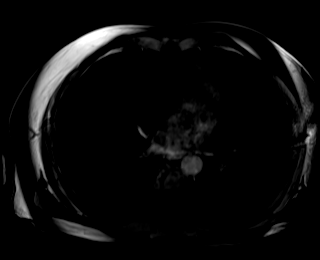

[Series 11: T1 dynamic post-contrast · axial · 3.0mm · 1.25mm/px · z∈[-183,+78]mm · 3 of 88 slices shown (1 of 9)]
[im 1/88]
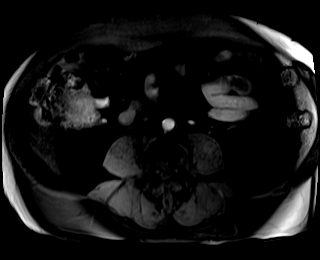
[im 44/88]
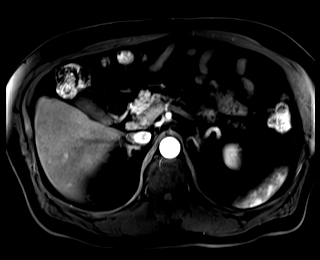
[im 88/88]
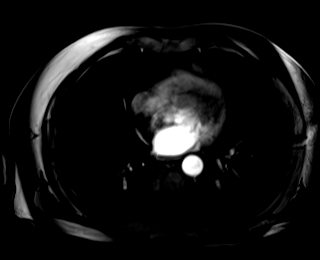

[Series 12: T1 dynamic post-contrast · axial · 3.0mm · 1.25mm/px · z∈[-183,+78]mm · 3 of 88 slices shown (2 of 9)]
[im 1/88]
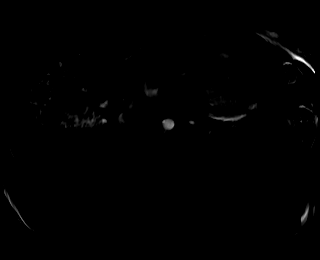
[im 44/88]
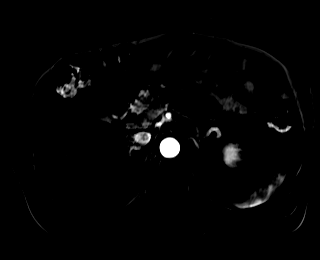
[im 88/88]
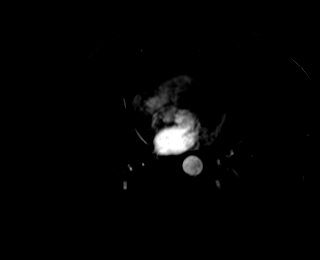

[Series 13: T1 dynamic post-contrast · axial · 3.0mm · 1.25mm/px · z∈[-183,+78]mm · 3 of 88 slices shown (3 of 9)]
[im 1/88]
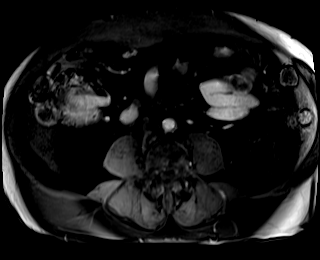
[im 44/88]
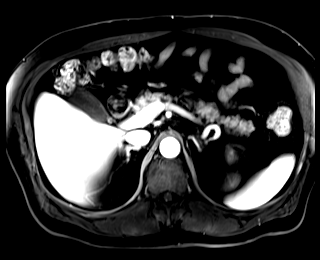
[im 88/88]
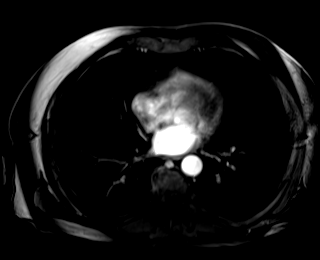

[Series 14: T1 dynamic post-contrast · axial · 3.0mm · 1.25mm/px · z∈[-183,+78]mm · 3 of 88 slices shown (4 of 9)]
[im 1/88]
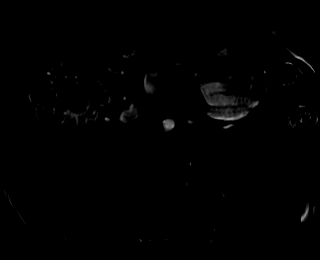
[im 44/88]
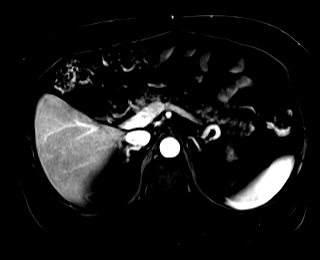
[im 88/88]
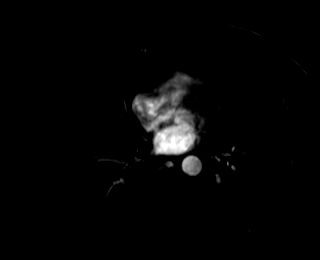

[Series 15: T1 dynamic post-contrast · axial · 3.0mm · 1.25mm/px · z∈[-183,+78]mm · 3 of 88 slices shown (5 of 9)]
[im 1/88]
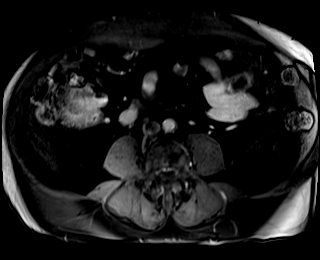
[im 44/88]
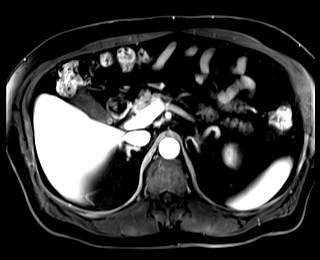
[im 88/88]
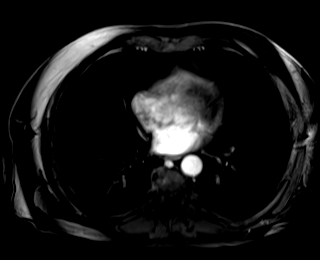

[Series 16: T1 dynamic post-contrast · axial · 3.0mm · 1.25mm/px · z∈[-183,+78]mm · 3 of 88 slices shown (6 of 9)]
[im 1/88]
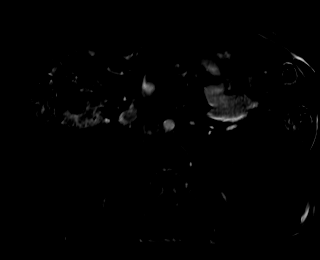
[im 44/88]
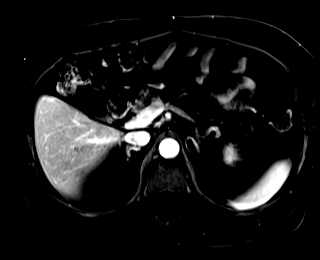
[im 88/88]
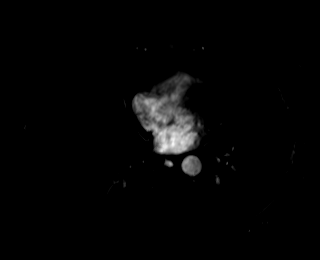

[Series 17: T1 dynamic post-contrast · coronal · 3.0mm · 1.25mm/px · 3 of 88 slices shown (7 of 9)]
[im 1/88]
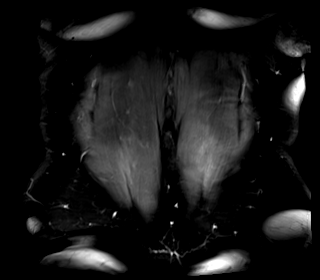
[im 44/88]
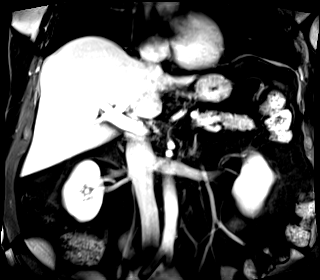
[im 88/88]
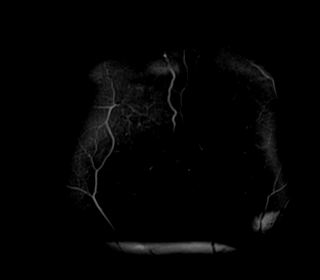

[Series 18: T1 dynamic post-contrast · axial · 3.0mm · 1.25mm/px · z∈[-183,+78]mm · 3 of 88 slices shown (8 of 9)]
[im 1/88]
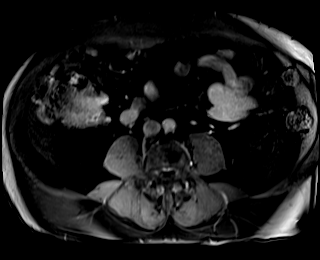
[im 44/88]
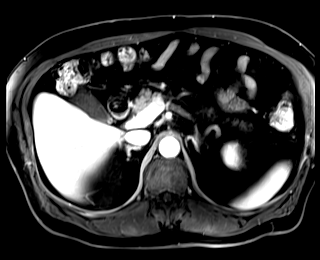
[im 88/88]
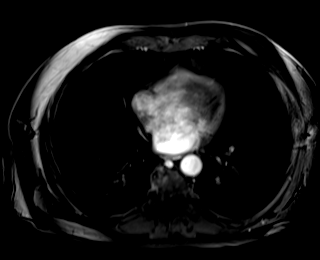

[Series 19: T1 dynamic post-contrast · axial · 3.0mm · 1.25mm/px · z∈[-183,+78]mm · 3 of 88 slices shown (9 of 9)]
[im 1/88]
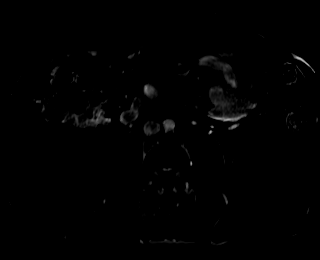
[im 44/88]
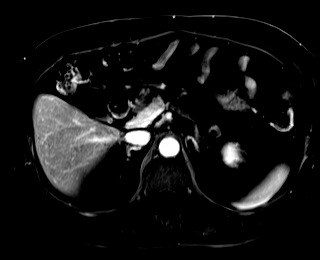
[im 88/88]
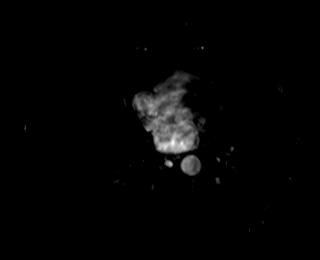

[48 of 48 positions shown; findings below may reference images not displayed]

FINDINGS: Lower chest: No acute findings.

Hepatobiliary: Liver is normal in size and contour. There is a
somewhat lobulated hyperintense T2 signal cystic lesion in the right
hepatic lobe segment 6 which measures up to 1.8 cm in size and
demonstrates irregular internal septation which measures
approximately 2-3 mm in thickness and enhances. Adjacent tiny
hepatic cyst just medially in the right hepatic lobe. Gallbladder
appears normal. No biliary ductal dilatation.

Pancreas: No mass, inflammatory changes, or other parenchymal
abnormality identified.

Spleen:  Within normal limits in size and appearance.

Adrenals/Urinary Tract: Adrenal glands appear normal. Several simple
and hemorrhagic/proteinaceous renal cortical cysts identified
bilaterally with the largest hemorrhagic cysts measuring 1.4 cm in
the lower pole right kidney and 1.7 cm in the upper pole left
kidney. No hydronephrosis.

Stomach/Bowel: Visualized portions within the abdomen are
unremarkable.

Vascular/Lymphatic: No pathologically enlarged lymph nodes
identified. No abdominal aortic aneurysm demonstrated.

Other:  No ascites.

Musculoskeletal: No suspicious bone lesions identified.
IMPRESSION: 1. Complex cyst in the right hepatic lobe segment 6 as described.
Consider follow-up MRI with contrast in 6 months to ensure
stability.
2. Multiple simple and hemorrhagic/proteinaceous renal cysts.

## 2023-09-24 ENCOUNTER — Other Ambulatory Visit: Payer: Self-pay | Admitting: Cardiovascular Disease

## 2023-10-12 ENCOUNTER — Telehealth: Payer: Self-pay | Admitting: Cardiovascular Disease

## 2023-10-12 NOTE — Telephone Encounter (Signed)
 Attempted to call patient, no answer left message requesting a call back.

## 2023-10-12 NOTE — Telephone Encounter (Signed)
 Patient is requesting to speak with a nurse for guidance for weight loss. Please advise.

## 2023-10-13 NOTE — Telephone Encounter (Signed)
 2nd attempt to call patient, no answer left message requesting a call back.

## 2023-10-14 NOTE — Telephone Encounter (Signed)
 3rd attempt to call patient, no answer, left message requesting a call back Nursing will await for patient to return call

## 2023-10-19 ENCOUNTER — Telehealth: Payer: Self-pay | Admitting: Cardiovascular Disease

## 2023-10-19 DIAGNOSIS — Z79899 Other long term (current) drug therapy: Secondary | ICD-10-CM

## 2023-10-19 NOTE — Telephone Encounter (Signed)
 Fountain, Madison L, NP  You1 minute ago (3:00 PM)    CMET and CBC can be ordered prior to visit. Thanks   Patient identification verified by 2 forms. Marilynn Rail, RN    Called and spoke to patient  Informed patient lab orders placed  Advised patient to present to office lab or [facility] corp facility near him  Patient verbalized understanding, no questions at this time

## 2023-10-19 NOTE — Telephone Encounter (Signed)
 Patient called stating he would like to have labs done prior to his visit on 3/20.  Please call when orders have been placed.

## 2023-10-23 LAB — CBC
Hematocrit: 48.4 % (ref 37.5–51.0)
Hemoglobin: 16.2 g/dL (ref 13.0–17.7)
MCH: 32.3 pg (ref 26.6–33.0)
MCHC: 33.5 g/dL (ref 31.5–35.7)
MCV: 96 fL (ref 79–97)
Platelets: 196 10*3/uL (ref 150–450)
RBC: 5.02 x10E6/uL (ref 4.14–5.80)
RDW: 12.7 % (ref 11.6–15.4)
WBC: 7.3 10*3/uL (ref 3.4–10.8)

## 2023-10-24 LAB — COMPREHENSIVE METABOLIC PANEL
ALT: 28 IU/L (ref 0–44)
AST: 18 IU/L (ref 0–40)
Albumin: 4.5 g/dL (ref 3.9–4.9)
Alkaline Phosphatase: 78 IU/L (ref 44–121)
BUN/Creatinine Ratio: 19 (ref 10–24)
BUN: 17 mg/dL (ref 8–27)
Bilirubin Total: 0.3 mg/dL (ref 0.0–1.2)
CO2: 27 mmol/L (ref 20–29)
Calcium: 10 mg/dL (ref 8.6–10.2)
Chloride: 104 mmol/L (ref 96–106)
Creatinine, Ser: 0.9 mg/dL (ref 0.76–1.27)
Globulin, Total: 2 g/dL (ref 1.5–4.5)
Glucose: 90 mg/dL (ref 70–99)
Potassium: 4.9 mmol/L (ref 3.5–5.2)
Sodium: 143 mmol/L (ref 134–144)
Total Protein: 6.5 g/dL (ref 6.0–8.5)
eGFR: 92 mL/min/{1.73_m2} (ref >59)

## 2023-10-26 ENCOUNTER — Telehealth: Payer: Self-pay | Admitting: *Deleted

## 2023-10-26 NOTE — Telephone Encounter (Signed)
 Spoke with patient regaurding his recent blood work. "Blood work shows normal kidney function, liver function, electrolytes, and blood counts.  Overall reassuring results.  Follow-up as planned." Per [facility]. Patient understood results and denied having any questions.

## 2023-11-05 ENCOUNTER — Ambulatory Visit: Payer: Medicare HMO | Attending: Emergency Medicine | Admitting: Emergency Medicine

## 2023-11-05 ENCOUNTER — Encounter: Payer: Self-pay | Admitting: Emergency Medicine

## 2023-11-05 VITALS — BP 134/76 | HR 66 | Ht 71.0 in | Wt 274.7 lb

## 2023-11-05 DIAGNOSIS — I10 Essential (primary) hypertension: Secondary | ICD-10-CM | POA: Diagnosis not present

## 2023-11-05 DIAGNOSIS — R350 Frequency of micturition: Secondary | ICD-10-CM

## 2023-11-05 DIAGNOSIS — E66812 Obesity, class 2: Secondary | ICD-10-CM

## 2023-11-05 DIAGNOSIS — G4733 Obstructive sleep apnea (adult) (pediatric): Secondary | ICD-10-CM | POA: Diagnosis not present

## 2023-11-05 DIAGNOSIS — E6609 Other obesity due to excess calories: Secondary | ICD-10-CM

## 2023-11-05 DIAGNOSIS — Z79899 Other long term (current) drug therapy: Secondary | ICD-10-CM

## 2023-11-05 DIAGNOSIS — J4520 Mild intermittent asthma, uncomplicated: Secondary | ICD-10-CM

## 2023-11-05 DIAGNOSIS — Z6838 Body mass index (BMI) 38.0-38.9, adult: Secondary | ICD-10-CM

## 2023-11-05 MED ORDER — LISINOPRIL 20 MG PO TABS: 30.0000 mg | ORAL_TABLET | Freq: Every day | ORAL | 3 refills | Status: AC

## 2023-11-05 NOTE — Patient Instructions (Addendum)
 Medication Instructions:  NO CHANGES    Lab Work: NONE   Testing/Procedures: NONE   Follow-Up: At [facility], you and your health needs are our priority.  As part of our continuing mission to provide you with exceptional heart care, we have created designated Provider Care Teams.  These Care Teams include your primary Cardiologist (physician) and Advanced Practice Providers (APPs -  Physician Assistants and Nurse Practitioners) who all work together to provide you with the care you need, when you need it.  We recommend signing up for the patient portal called "MyChart".  Sign up information is provided on this After Visit Summary.  MyChart is used to connect with patients for Virtual Visits (Telemedicine).  Patients are able to view lab/test results, encounter notes, upcoming appointments, etc.  Non-urgent messages can be sent to your provider as well.   To learn more about what you can do with MyChart, go to [email or web address].    Your next appointment:   1 year(s)  Provider:   Rise Paganini, DNP  Other Instructions:  [facility] AT [phone] TO ESTABLISH CARE WITH A PRIMARY CARE PHYSICIAN.

## 2023-11-05 NOTE — Progress Notes (Signed)
 Cardiology Office Note:    Date:  11/05/2023  ID:  Roderic Lammert, DOB 05-Jan-1951, MRN [id number] PCP: Reather Converse, PA-C  [facility] Providers Cardiologist:  Nicki Guadalajara, MD       Patient Profile:      Chief Complaint: 1 year follow-up for hypertension, sleep apnea  History of Present Illness:  Blakeley Margraf is a 73 y.o. male with visit-pertinent history of hypertension, obesity, vitamin D deficiency, obstructive sleep apnea  He established care with cardiology service on 05/29/2020 for hypertension and sleep apnea.  He underwent split-night sleep study on 12/2013 which revealed severe obstructive sleep apnea.  CPAP was initiated.  He was last seen in clinic on 05/28/2022 by Dr. Tresa Endo.  He was doing well at the time without cardiovascular concerns or complaints.  He was then follow-up in 1 year.   Discussed the use of AI scribe software for clinical note transcription with the patient, who gave verbal consent to proceed.  Today he comes into clinic by himself without acute cardiovascular concerns or complaints.  Over the past year he has experienced significant weight gain over the past year, reaching 274 pounds (previously 230 during last OV), attributed to irregular work shifts and lack of exercise.  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  He notes his diet has been poor over the past year which she thinks is a major contributor to his weight gain.  He has hypertension, managed on lisinopril 30 mg daily.  He does not take his blood pressure at home however controlled in office today.  He has sleep apnea, managed with a CPAP for which he has remained adherent to.   He experiences frequent urination, up to fifteen times a day, with difficulty initiating urination and incomplete bladder emptying. He has asthma, managed with montelukast, with infrequent attacks.   Lastly, he no longer sees a primary  care provider as his primary and recently retired.    Review of systems:  Please see the history of present illness. All other systems are reviewed and otherwise negative.     Home Medications:    Current Meds  Medication Sig   Calcium-Magnesium-Vitamin D (CALCIUM MAGNESIUM PO) Take 1 capsule by mouth daily.   cholecalciferol (VITAMIN D) 1000 UNITS tablet Take 1,000 Units by mouth daily.   GARLIC PO Take 1 tablet by mouth daily.   montelukast (SINGULAIR) 10 MG tablet Take 10 mg by mouth daily with breakfast.    Omega-3 Fatty Acids (FISH OIL PO) Take 1 capsule by mouth daily.   polyethylene glycol (MIRALAX / GLYCOLAX) 17 g packet Take 17 g by mouth daily.   tamsulosin (FLOMAX) 0.4 MG CAPS capsule Take 0.4 mg by mouth daily.   vitamin C (ASCORBIC ACID) 500 MG tablet Take 500 mg by mouth daily.   vitamin E 1000 UNIT capsule Take 1,000 Units by mouth 2 (two) times daily.    [DISCONTINUED] lisinopril (ZESTRIL) 20 MG tablet TAKE 1 AND 1/2 TABLETS BY MOUTH DAILY   Studies Reviewed:   EKG Interpretation Date/Time:  Thursday November 05 2023 10:59:53 EDT Ventricular Rate:  66 PR Interval:  152 QRS Duration:  86 QT Interval:  396 QTC Calculation: 415 R Axis:   80  Text Interpretation: Normal sinus rhythm Normal ECG Confirmed by Rise Paganini [id number]) on 11/05/2023 11:41:05 AM    No recent cardiovascular studies Risk Assessment/Calculations:  Physical Exam:   VS:  BP 134/76   Pulse 66   Ht 5\' 11"  (1.803 m)   Wt 274 lb 11.2 oz (124.6 kg)   SpO2 96%   BMI 38.31 kg/m    Wt Readings from Last 3 Encounters:  11/05/23 274 lb 11.2 oz (124.6 kg)  05/28/22 230 lb (104.3 kg)  12/14/21 232 lb (105.2 kg)    GEN: Well nourished, well developed in no acute distress NECK: No JVD; No carotid bruits CARDIAC: RRR, no murmurs, rubs, gallops RESPIRATORY:  Clear to auscultation without rales, wheezing or rhonchi  ABDOMEN: Soft, non-tender, non-distended EXTREMITIES:  No edema; No  acute deformity      Assessment and Plan:  Hypertension Blood pressure today is 134/76 and slightly above goal of less than 130/80 Blood pressure likely slightly increased due to recent sedentary lifestyle and weight gain EKG today showed normal sinus rhythm without acute ST or T wave changes -Begin monitoring blood pressure at home and alert office for consistent blood pressure greater than 130/80.  Blood pressure does remain above goal can increase lisinopril to 40 mg at that time -Continue lisinopril 30 mg daily  Obstructive sleep apnea Remains adherent to CPAP  Obesity BMI of 38.31 He has gained approximately 40 pounds in the past year, which he attributes to his new job as a Electrical engineer for which he works night shift and dayshift.  He also notes poor diet and sedentary lifestyle to be a major contributor -Encouraged to start looking for a job with more manageable hours -Encouraged 150 minutes of moderate intensity aerobic exercise weekly -Recommend DASH diet (high in vegetables, fruits, low-fat dairy products, whole grains, poultry, fish, and nuts and low in sweets, sugar-sweetened beverages, and red meats), salt restriction and increase physical activity.   Asthma Well-controlled on montelukast -Needs to establish with PCP as noted below  Urinary frequency He has began to experience frequent urination up to 15 times a day with difficulty initiating urination and incomplete bladder emptying Has seen a urologist in the past and has been on Flomax in the past as well, however this was stopped for unknown reasoning -Will have him establish with new PCP for ongoing care  General Health Maintenance He was previously followed by Dr. Pollyann Kennedy at urgent care.  He notes Dr. Pollyann Kennedy has recently retired.  Patient was given information to establish care with [facility] at [address].           Dispo:  Return in about 1 year (around  11/04/2024).  Signed, Denyce Robert, NP

## 2023-12-29 ENCOUNTER — Ambulatory Visit (HOSPITAL_BASED_OUTPATIENT_CLINIC_OR_DEPARTMENT_OTHER): Admitting: Family Medicine

## 2024-06-24 ENCOUNTER — Telehealth: Payer: Self-pay | Admitting: Emergency Medicine

## 2024-06-24 NOTE — Telephone Encounter (Signed)
 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 Can you all please advise on if he can get this letter/ when he would be able to get this?  Callback # [phone]

## 2024-08-23 ENCOUNTER — Telehealth: Payer: Self-pay | Admitting: Emergency Medicine

## 2024-08-23 NOTE — Telephone Encounter (Signed)
 Pt is sick. Current BP 125/74 HR 73. Pt request antibiotics. Advised pt to call PCP. Pt stated understanding.   Pt also wanted to know status of Work letter he requested (see encounter 06/24/24). Advised pt I would send to [facility]. Pt stated understanding.

## 2024-08-23 NOTE — Telephone Encounter (Signed)
Pt returning nurses call from this morning. Please advise 

## 2024-08-23 NOTE — Telephone Encounter (Signed)
"  Left message to call back.   "

## 2024-08-23 NOTE — Telephone Encounter (Signed)
 STAT if HR is under 50 or over 120  (normal HR is 60-100 beats per minute)  What is your heart rate?  101. Patient is concerned because it is normally in the 60-70's.  Do you have a log of your heart rate readings (document readings)?  107/76 101 this morning.  Do you have any other symptoms?  Not other than cold symptoms--asthma/trouble breathing, coughing, congestion.

## 2024-08-26 NOTE — Telephone Encounter (Signed)
 Pt calling back in regards to work letter. Pt states that he needs letter stating that he no longer needs to work 3rd shift due to the BP meds that he takes as well as his age. Pt states that he was advised to this this 6-8 months ago by PA, Rana but didn't think that he needed to do so at that time. Please advise

## 2024-08-29 ENCOUNTER — Telehealth: Payer: Self-pay | Admitting: *Deleted

## 2024-08-29 NOTE — Telephone Encounter (Signed)
 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 I told patient to check with his PCP for that work letter. He understood.

## 2024-09-09 ENCOUNTER — Other Ambulatory Visit: Payer: Self-pay | Admitting: Urology

## 2024-09-22 NOTE — Progress Notes (Signed)
 Date of COVID positive in last 90 days:  PCP - Lyndal Norrie, PA-C Cardiologist - Lum Louis, NP (for HTN and and OSA)  Chest x-ray - N/A EKG - 11-05-23 Epic Stress Test - N/A ECHO - N/A Cardiac Cath - N/A Pacemaker/ICD device last checked:N/A Spinal Cord Stimulator:N/A  Bowel Prep - N/A  Sleep Study - Yes, +sleep apnea CPAP -   Fasting Blood Sugar - N/A Checks Blood Sugar _____ times a day  Last dose of GLP1 agonist-  N/A GLP1 instructions:  Do not take after     Last dose of SGLT-2 inhibitors-  N/A SGLT-2 instructions:  Do not take after     Blood Thinner Instructions: N/A Last dose:   Time: Aspirin Instructions:N/A Last Dose:  Activity level:  Can go up a flight of stairs and perform activities of daily living without stopping and without symptoms of chest pain or shortness of breath.  Able to exercise without symptoms  Unable to go up a flight of stairs without symptoms of     Anesthesia review: N/A  Patient denies shortness of breath, fever, cough and chest pain at PAT appointment  Patient verbalized understanding of instructions that were given to them at the PAT appointment. Patient was also instructed that they will need to review over the PAT instructions again at home before surgery.

## 2024-09-22 NOTE — Patient Instructions (Addendum)
 SURGICAL WAITING ROOM VISITATION Patients having surgery or a procedure may have no more than 2 support people in the waiting area - these visitors may rotate.    Children under the age of 40 will not be allowed to visit due to the increase in respiratory illness  Children under the age of 2 must have an adult with them who is not the patient.  If the patient needs to stay at the hospital during part of their recovery, the visitor guidelines for inpatient rooms apply. Pre-op nurse will coordinate an appropriate time for 1 support person to accompany patient in pre-op.  This support person may not rotate.    Please refer to the [facility] website for the visitor guidelines for Inpatients (after your surgery is over and you are in a regular room).       Your procedure is scheduled on: 10-06-24   Report to [facility] Main Entrance    Report to admitting at 9:15 AM   Call this number if you have problems the morning of surgery [phone]   Do not eat food or drink liquids :After Midnight.          If you have questions, please contact your surgeons office.   FOLLOW ANY ADDITIONAL PRE OP INSTRUCTIONS YOU RECEIVED FROM YOUR SURGEON'S OFFICE!!!     Oral Hygiene is also important to reduce your risk of infection.                                    Remember - BRUSH YOUR TEETH THE MORNING OF SURGERY WITH YOUR REGULAR TOOTHPASTE   Do NOT smoke after Midnight   Take these medicines the morning of surgery with A SIP OF WATER:    Montelukast (Singulair)   Tamsulosin (Flomax)  Stop all vitamins and herbal supplements 7 days before surgery  Bring CPAP mask and tubing day of surgery.                              You may not have any metal on your body including  jewelry, and body piercing             Do not wear  lotions, powders, cologne, or deodorant              Men may shave face and neck.   Do not bring valuables to the hospital. [facility] IS NOT RESPONSIBLE   FOR  VALUABLES.   Contacts, dentures or bridgework may not be worn into surgery.  DO NOT BRING YOUR HOME MEDICATIONS TO THE HOSPITAL. PHARMACY WILL DISPENSE MEDICATIONS LISTED ON YOUR MEDICATION LIST TO YOU DURING YOUR ADMISSION IN THE HOSPITAL!    Patients discharged on the day of surgery will not be allowed to drive home.  Someone NEEDS to stay with you for the first 24 hours after anesthesia.   Special Instructions: Bring a copy of your healthcare power of attorney and living will documents the day of surgery if you haven't scanned them before.              Please read over the following fact sheets you were given: IF YOU HAVE QUESTIONS ABOUT YOUR PRE-OP INSTRUCTIONS PLEASE CALL [phone] [place] or [phone]  If you received a COVID test during your pre-op visit  it is requested that you wear a mask when out in public,  stay away from anyone that may not be feeling well and notify your surgeon if you develop symptoms. If you test positive for Covid or have been in contact with anyone that has tested positive in the last 10 days please notify you surgeon.  [facility] - Preparing for Surgery Before surgery, you can play an important role.  Because skin is not sterile, your skin needs to be as free of germs as possible.  You can reduce the number of germs on your skin by washing with CHG (chlorahexidine gluconate) soap before surgery.  CHG is an antiseptic cleaner which kills germs and bonds with the skin to continue killing germs even after washing. Please DO NOT use if you have an allergy to CHG or antibacterial soaps.  If your skin becomes reddened/irritated stop using the CHG and inform your nurse when you arrive at Short Stay. Do not shave (including legs and underarms) for at least 48 hours prior to the first CHG shower.  You may shave your face/neck.  Please follow these instructions carefully:  1.  Shower with CHG Soap the night before surgery and the  morning of surgery.  2.  If you  choose to wash your hair, wash your hair first as usual with your normal  shampoo.  3.  After you shampoo, rinse your hair and body thoroughly to remove the shampoo.                             4.  Use CHG as you would any other liquid soap.  You can apply chg directly to the skin and wash.  Gently with a scrungie or clean washcloth.  5.  Apply the CHG Soap to your body ONLY FROM THE NECK DOWN.   Do   not use on face/ open                           Wound or open sores. Avoid contact with eyes, ears mouth and   genitals (private parts).                       Wash face,  Genitals (private parts) with your normal soap.             6.  Wash thoroughly, paying special attention to the area where your    surgery  will be performed.  7.  Thoroughly rinse your body with warm water from the neck down.  8.  DO NOT shower/wash with your normal soap after using and rinsing off the CHG Soap.                9.  Pat yourself dry with a clean towel.            10.  Wear clean pajamas.            11.  Place clean sheets on your bed the night of your first shower and do not  sleep with pets. Day of Surgery : Do not apply any lotions/deodorants the morning of surgery.  Please wear clean clothes to the hospital/surgery center.  FAILURE TO FOLLOW THESE INSTRUCTIONS MAY RESULT IN THE CANCELLATION OF YOUR SURGERY  PATIENT SIGNATURE_________________________________  NURSE SIGNATURE__________________________________  ________________________________________________________________________

## 2024-09-27 ENCOUNTER — Encounter (HOSPITAL_COMMUNITY): Admission: RE | Admit: 2024-09-27

## 2024-09-27 DIAGNOSIS — Z01818 Encounter for other preprocedural examination: Secondary | ICD-10-CM

## 2024-09-27 DIAGNOSIS — I10 Essential (primary) hypertension: Secondary | ICD-10-CM

## 2024-10-06 ENCOUNTER — Ambulatory Visit (HOSPITAL_COMMUNITY): Admit: 2024-10-06 | Admitting: Urology

## 2024-11-04 ENCOUNTER — Ambulatory Visit: Admitting: Emergency Medicine
